# Patient Record
Sex: Male | Born: 1965
Health system: Southern US, Community
[De-identification: ages and names within clinical notes are randomized; demographics above are authoritative.]

## PROBLEM LIST (undated history)

## (undated) DIAGNOSIS — G709 Myoneural disorder, unspecified: Secondary | ICD-10-CM

## (undated) DIAGNOSIS — M1990 Unspecified osteoarthritis, unspecified site: Secondary | ICD-10-CM

## (undated) HISTORY — DX: Myoneural disorder, unspecified: G70.9

## (undated) HISTORY — DX: Unspecified osteoarthritis, unspecified site: M19.90

---

## 1983-08-04 HISTORY — PX: BACK SURGERY: SHX140

## 1995-08-04 HISTORY — PX: KNEE SURGERY: SHX244

## 1998-10-08 ENCOUNTER — Encounter: Payer: Self-pay | Admitting: Emergency Medicine

## 1998-10-08 ENCOUNTER — Emergency Department (HOSPITAL_COMMUNITY): Admission: EM | Admit: 1998-10-08 | Discharge: 1998-10-08 | Payer: Self-pay | Admitting: Emergency Medicine

## 1998-12-09 ENCOUNTER — Ambulatory Visit (HOSPITAL_BASED_OUTPATIENT_CLINIC_OR_DEPARTMENT_OTHER): Admission: RE | Admit: 1998-12-09 | Discharge: 1998-12-09 | Payer: Self-pay | Admitting: Orthopedic Surgery

## 2006-09-26 ENCOUNTER — Emergency Department (HOSPITAL_COMMUNITY): Admission: EM | Admit: 2006-09-26 | Discharge: 2006-09-27 | Payer: Self-pay | Admitting: Emergency Medicine

## 2007-02-12 ENCOUNTER — Emergency Department (HOSPITAL_COMMUNITY): Admission: EM | Admit: 2007-02-12 | Discharge: 2007-02-12 | Payer: Self-pay | Admitting: Emergency Medicine

## 2007-02-14 ENCOUNTER — Emergency Department (HOSPITAL_COMMUNITY): Admission: EM | Admit: 2007-02-14 | Discharge: 2007-02-14 | Payer: Self-pay | Admitting: Emergency Medicine

## 2007-07-06 ENCOUNTER — Emergency Department (HOSPITAL_COMMUNITY): Admission: EM | Admit: 2007-07-06 | Discharge: 2007-07-06 | Payer: Self-pay | Admitting: Emergency Medicine

## 2007-09-02 ENCOUNTER — Ambulatory Visit (HOSPITAL_COMMUNITY): Admission: RE | Admit: 2007-09-02 | Discharge: 2007-09-02 | Payer: Self-pay | Admitting: Neurosurgery

## 2010-12-15 ENCOUNTER — Observation Stay (HOSPITAL_COMMUNITY)
Admission: EM | Admit: 2010-12-15 | Discharge: 2010-12-16 | Disposition: A | Discharge status: Home / Self Care | Payer: Medicare Other | Attending: Emergency Medicine | Admitting: Emergency Medicine

## 2010-12-15 ENCOUNTER — Emergency Department (HOSPITAL_COMMUNITY): Payer: Medicare Other

## 2010-12-15 DIAGNOSIS — R059 Cough, unspecified: Secondary | ICD-10-CM | POA: Insufficient documentation

## 2010-12-15 DIAGNOSIS — R5383 Other fatigue: Secondary | ICD-10-CM | POA: Insufficient documentation

## 2010-12-15 DIAGNOSIS — J189 Pneumonia, unspecified organism: Secondary | ICD-10-CM | POA: Insufficient documentation

## 2010-12-15 DIAGNOSIS — R05 Cough: Secondary | ICD-10-CM | POA: Insufficient documentation

## 2010-12-15 DIAGNOSIS — E860 Dehydration: Principal | ICD-10-CM | POA: Insufficient documentation

## 2010-12-15 DIAGNOSIS — R51 Headache: Secondary | ICD-10-CM | POA: Insufficient documentation

## 2010-12-15 DIAGNOSIS — R5381 Other malaise: Secondary | ICD-10-CM | POA: Insufficient documentation

## 2010-12-15 DIAGNOSIS — IMO0001 Reserved for inherently not codable concepts without codable children: Secondary | ICD-10-CM | POA: Insufficient documentation

## 2010-12-15 DIAGNOSIS — R112 Nausea with vomiting, unspecified: Secondary | ICD-10-CM | POA: Insufficient documentation

## 2010-12-15 DIAGNOSIS — R197 Diarrhea, unspecified: Secondary | ICD-10-CM | POA: Insufficient documentation

## 2010-12-15 LAB — POCT I-STAT, CHEM 8
Calcium, Ion: 1.09 mmol/L — ABNORMAL LOW (ref 1.12–1.32)
Glucose, Bld: 124 mg/dL — ABNORMAL HIGH (ref 70–99)
Hemoglobin: 15.6 g/dL (ref 13.0–17.0)
Potassium: 3.6 mEq/L (ref 3.5–5.1)
Sodium: 137 mEq/L (ref 135–145)
TCO2: 27 mmol/L (ref 0–100)

## 2010-12-15 LAB — COMPREHENSIVE METABOLIC PANEL
CO2: 29 mEq/L (ref 19–32)
Calcium: 9.3 mg/dL (ref 8.4–10.5)
GFR calc non Af Amer: >60 mL/min (ref >60)
Potassium: 3.5 mEq/L (ref 3.5–5.1)
Sodium: 138 mEq/L (ref 135–145)
Total Bilirubin: 0.7 mg/dL (ref 0.3–1.2)

## 2010-12-16 LAB — HEPATITIS PANEL, ACUTE
HCV Ab: NEGATIVE
Hep A IgM: NEGATIVE

## 2012-06-22 ENCOUNTER — Emergency Department (HOSPITAL_COMMUNITY)
Admission: EM | Admit: 2012-06-22 | Discharge: 2012-06-23 | Disposition: A | Discharge status: Home / Self Care | Payer: Medicare Other | Attending: Emergency Medicine | Admitting: Emergency Medicine

## 2012-06-22 ENCOUNTER — Encounter (HOSPITAL_COMMUNITY): Payer: Self-pay | Admitting: Emergency Medicine

## 2012-06-22 DIAGNOSIS — R42 Dizziness and giddiness: Secondary | ICD-10-CM | POA: Insufficient documentation

## 2012-06-22 DIAGNOSIS — R109 Unspecified abdominal pain: Secondary | ICD-10-CM | POA: Insufficient documentation

## 2012-06-22 DIAGNOSIS — R112 Nausea with vomiting, unspecified: Secondary | ICD-10-CM | POA: Insufficient documentation

## 2012-06-22 DIAGNOSIS — M62830 Muscle spasm of back: Secondary | ICD-10-CM

## 2012-06-22 DIAGNOSIS — M5380 Other specified dorsopathies, site unspecified: Secondary | ICD-10-CM | POA: Insufficient documentation

## 2012-06-22 LAB — URINALYSIS, ROUTINE W REFLEX MICROSCOPIC
Bilirubin Urine: NEGATIVE
Ketones, ur: NEGATIVE mg/dL
Specific Gravity, Urine: 1.024 (ref 1.005–1.030)

## 2012-06-22 LAB — BASIC METABOLIC PANEL
BUN: 14 mg/dL (ref 6–23)
CO2: 29 mEq/L (ref 19–32)
Calcium: 9.5 mg/dL (ref 8.4–10.5)
Chloride: 104 mEq/L (ref 96–112)
Creatinine, Ser: 1.12 mg/dL (ref 0.50–1.35)
GFR calc Af Amer: 88 mL/min — ABNORMAL LOW (ref >90)
GFR calc non Af Amer: 76 mL/min — ABNORMAL LOW (ref >90)
Glucose, Bld: 106 mg/dL — ABNORMAL HIGH (ref 70–99)
Potassium: 4.4 mEq/L (ref 3.5–5.1)

## 2012-06-22 LAB — CBC WITH DIFFERENTIAL/PLATELET
Basophils Absolute: 0 10*3/uL (ref 0.0–0.1)
Eosinophils Absolute: 0.3 10*3/uL (ref 0.0–0.7)
Lymphocytes Relative: 19 % (ref 12–46)
Monocytes Absolute: 0.5 10*3/uL (ref 0.1–1.0)
Monocytes Relative: 6 % (ref 3–12)
Neutro Abs: 5.8 10*3/uL (ref 1.7–7.7)
Neutrophils Relative %: 71 % (ref 43–77)
RDW: 12.8 % (ref 11.5–15.5)
WBC: 8.2 10*3/uL (ref 4.0–10.5)

## 2012-06-22 MED ORDER — MECLIZINE HCL 25 MG PO TABS
25.0000 mg | ORAL_TABLET | Freq: Once | ORAL | Status: AC
  Administered 2012-06-22: 25 mg via ORAL
  Filled 2012-06-22: qty 1

## 2012-06-22 MED ORDER — DIAZEPAM 5 MG PO TABS
10.0000 mg | ORAL_TABLET | Freq: Once | ORAL | Status: AC
  Administered 2012-06-22: 10 mg via ORAL
  Filled 2012-06-22: qty 2

## 2012-06-22 MED ORDER — HYDROCODONE-ACETAMINOPHEN 5-325 MG PO TABS
2.0000 | ORAL_TABLET | Freq: Once | ORAL | Status: AC
  Administered 2012-06-22: 2 via ORAL
  Filled 2012-06-22: qty 2

## 2012-06-22 NOTE — ED Notes (Addendum)
Pt presents with dizziness for the past 2 weeks, states it occurs when he moves, becomes lightheaded and has blurred vision.  Complaining of tightness in left side of chest.  Also states he becomes nauseas with movement.  Denies any vomiting today.  Pt having left flank pain that began 2 days ago, sts urine is darker in color than normal.  No acute distress at this time.

## 2012-06-22 NOTE — ED Notes (Signed)
Pt c/o left flank pain x 3 days and intermittent dizziness x 2 weeks; pt sts painful with movement and denies pain with urination

## 2012-06-22 NOTE — ED Notes (Signed)
No answer in waiting area when pt called for treatment room.

## 2012-06-22 NOTE — ED Notes (Signed)
Patient ambulated independently around the department.  Patient states he feels much better, denies any dizziness and states that his back is no longer hurting.

## 2012-06-22 NOTE — ED Provider Notes (Signed)
History     CSN: [id number]  Arrival date & time 06/22/12  1840   First MD Initiated Contact with Patient 06/22/12 2148      Chief Complaint  Patient presents with  . Flank Pain  . Dizziness    (Consider location/radiation/quality/duration/timing/severity/associated sxs/prior treatment) The history is provided by the patient, a relative and medical records.   presents to the emergency department complaining of dizziness for greater than 2 weeks and 3 days for the back pain.  He states the dizziness began quickly but not acutely, have been persistent and gradually worsening. He has no history of vertigo. He describes the dizziness as the room spinning. He states that it is worse when he stands or attempts to walk. He states that at the dizziness worse and he developed severe nausea and occasional vomiting. He states the symptoms are better when he lies down and worse when he stands up. He has tried taking Dramamine without improvement in the symptoms. He has associated nausea and vomiting. He denies headache, otalgia, tinnitus, chest pain, shortness of breath, abdominal pain, diarrhea, constipation, weakness, syncope.  Patient also complains of left back pain. Patient states he has an extensive history of back pain with previous back fractures and surgeries. He states he is a fall and has no injury this time. He states he's been in bed with the dizziness and he awoke 3 days ago with pain in the left side of his back. He states he is unsure about whether or not it is the kidney. He states the pain worsened severely when he coughs, sneezes, moves, twists, walks. He states that lying still makes the pain feel better. He has not taken any medicine for his back pain either. He denies numbness, tingling, weakness in his legs. He denies dysuria, hematuria, frequency, urgency, abdominal pain. He has no history of kidney stones.  Patient denies loss of function of his legs, loss of bowel or bladder  function or saddle anesthesia.    Anthony Landry is a 46 y.o. male   History reviewed. No pertinent past medical history.  History reviewed. No pertinent past surgical history.  History reviewed. No pertinent family history.  History  Substance Use Topics  . Smoking status: Never Smoker   . Smokeless tobacco: Not on file  . Alcohol Use: No      Review of Systems  Constitutional: Negative for fever, diaphoresis, appetite change, fatigue and unexpected weight change.  HENT: Negative for hearing loss, ear pain, congestion, rhinorrhea, sneezing, drooling, mouth sores, trouble swallowing, neck pain, neck stiffness, postnasal drip, sinus pressure and tinnitus.   Eyes: Negative for visual disturbance.  Respiratory: Negative for cough, chest tightness, shortness of breath and wheezing.   Cardiovascular: Negative for chest pain.  Gastrointestinal: Positive for nausea and vomiting. Negative for abdominal pain, diarrhea and constipation.  Genitourinary: Negative for dysuria, urgency, frequency and hematuria.  Musculoskeletal: Positive for back pain and gait problem (secondary back pain).  Skin: Negative for rash.  Neurological: Positive for dizziness. Negative for syncope, light-headedness and headaches.  Psychiatric/Behavioral: Negative for sleep disturbance. The patient is not nervous/anxious.   All other systems reviewed and are negative.    Allergies  Review of patient's allergies indicates no known allergies.  Home Medications   Current Outpatient Rx  Name  Route  Sig  Dispense  Refill  . MORPHINE SULFATE ER 60 MG PO TBCR   Oral   Take 60 mg by mouth 3 (three) times daily as needed. Back  pain         . OXYCODONE HCL 15 MG PO TABS   Oral   Take 15 mg by mouth every 8 (eight) hours as needed. Back pain         . MECLIZINE HCL 50 MG PO TABS   Oral   Take 0.5 tablets (25 mg total) by mouth 3 (three) times daily as needed.   30 tablet   0   . METHOCARBAMOL 500 MG PO  TABS   Oral   Take 1 tablet (500 mg total) by mouth 2 (two) times daily.   20 tablet   0     BP 123/77  Pulse 66  Temp 98.5 F (36.9 C) (Oral)  Resp 19  SpO2 99%  Physical Exam  Nursing note and vitals reviewed. Constitutional: He is oriented to person, place, and time. He appears well-developed and well-nourished. No distress.  HENT:  Head: Normocephalic and atraumatic.  Right Ear: Tympanic membrane, external ear and ear canal normal.  Left Ear: Tympanic membrane, external ear and ear canal normal.  Nose: Nose normal. Right sinus exhibits no maxillary sinus tenderness and no frontal sinus tenderness. Left sinus exhibits no maxillary sinus tenderness and no frontal sinus tenderness.  Mouth/Throat: Uvula is midline, oropharynx is clear and moist and mucous membranes are normal. Mucous membranes are not cyanotic. No uvula swelling. No oropharyngeal exudate.  Eyes: Conjunctivae normal and EOM are normal. Pupils are equal, round, and reactive to light. No scleral icterus.  Neck: Normal range of motion. Neck supple.  Cardiovascular: Normal rate, regular rhythm and intact distal pulses.   Pulmonary/Chest: Effort normal and breath sounds normal. No respiratory distress. He has no wheezes.  Abdominal: Soft. Bowel sounds are normal. He exhibits no distension and no mass. There is no tenderness. There is no rebound and no guarding.  Musculoskeletal: Normal range of motion. He exhibits tenderness. He exhibits no edema.       Tenderness to palpation of the left paraspinal muscles, no tenderness to palpation of the spinous processes Well healed surgical incision of the thoracic spine  Lymphadenopathy:    He has no cervical adenopathy.  Neurological: He is alert and oriented to person, place, and time. He has normal reflexes. He exhibits normal muscle tone. Coordination normal.       Speech is clear and goal oriented, follows commands Major Cranial nerves without deficit,  Normal strength in  upper and lower extremities bilaterally including dorsiflexion and plantar flexion, strong and equal grip strength Sensation normal to light and sharp touch Moves extremities without ataxia, coordination intact Normal gait and balance Positive Dix Hallpike maneuver   Skin: Skin is warm and dry. No rash noted. He is not diaphoretic.  Psychiatric: He has a normal mood and affect.    ED Course  Procedures (including critical care time)  Labs Reviewed  BASIC METABOLIC PANEL - Abnormal; Notable for the following:    Glucose, Bld 106 (*)     GFR calc non Af Amer 76 (*)     GFR calc Af Amer 88 (*)     All other components within normal limits  CBC WITH DIFFERENTIAL  URINALYSIS, ROUTINE W REFLEX MICROSCOPIC   No results found.  ECG:  Date: 06/22/2012  Rate: 72  Rhythm: normal sinus rhythm  QRS Axis: normal  Intervals: normal  ST/T Wave abnormalities: normal  Conduction Disutrbances:none  Narrative Interpretation: non-ischemic ECG  Old EKG Reviewed: none available    1. Vertigo   2.  Back muscle spasm       MDM  [name] presents with dizziness and back pain.  Patient with positive Gilberto Better, history and PE consistent with benign positional vertigo.  Patient given meclizine with resolution of dizziness nausea and vomiting.  Patient history and exam consistent with muscle spasm of the thoracic spine.  Patient given Vicodin and Valium with resolution of back pain. Patient now able to ambulate without difficulty; alert, oriented, NAD, nontoxic, nonseptic appearing.  No red flags concerning patient's back pain. No s/s of central cord compression or cauda equina. Lower extremities are neurovascularly intact and patient is ambulating without difficulty.  BMP, urinalysis and CBC are unremarkable.  ECG nonischemic.  Patient states he feels much better and is ready to go home.  I have also discussed reasons to return immediately to the ER.  Patient expresses understanding and agrees  with plan.  1. Medications: Meclizine - Take as needed for vertigo symptoms; Robaxin - take as needed for muscle spasm, take your already prescribed Roxicodone for back pain  2. Treatment: Rest, ice, gentle stretching, take medications as prescribed  3. Follow Up: [facility] urgent care and/or your pain management            Dierdre Forth, PA-C 06/23/12 0008

## 2012-06-23 MED ORDER — METHOCARBAMOL 500 MG PO TABS: 500.0000 mg | ORAL_TABLET | Freq: Two times a day (BID) | ORAL | Status: DC

## 2012-06-23 MED ORDER — MECLIZINE HCL 50 MG PO TABS: 25.0000 mg | ORAL_TABLET | Freq: Three times a day (TID) | ORAL | Status: DC | PRN

## 2012-06-23 NOTE — ED Provider Notes (Signed)
Medical screening examination/treatment/procedure(s) were performed by non-physician practitioner and as supervising physician I was immediately available for consultation/collaboration.    Margeaux Swantek L Ellenie Salome, MD 06/23/12 2229 

## 2014-11-10 DIAGNOSIS — R030 Elevated blood-pressure reading, without diagnosis of hypertension: Secondary | ICD-10-CM | POA: Insufficient documentation

## 2014-11-10 DIAGNOSIS — G8929 Other chronic pain: Secondary | ICD-10-CM | POA: Insufficient documentation

## 2015-01-01 ENCOUNTER — Encounter: Payer: Self-pay | Admitting: Pain Medicine

## 2015-01-01 ENCOUNTER — Encounter (INDEPENDENT_AMBULATORY_CARE_PROVIDER_SITE_OTHER): Payer: Self-pay

## 2015-01-01 ENCOUNTER — Ambulatory Visit: Payer: PPO | Attending: Pain Medicine | Admitting: Pain Medicine

## 2015-01-01 VITALS — BP 132/85 | HR 63 | Temp 98.4°F | Resp 16 | Ht 68.0 in | Wt 160.0 lb

## 2015-01-01 DIAGNOSIS — M5481 Occipital neuralgia: Secondary | ICD-10-CM | POA: Insufficient documentation

## 2015-01-01 DIAGNOSIS — M79601 Pain in right arm: Secondary | ICD-10-CM | POA: Diagnosis present

## 2015-01-01 DIAGNOSIS — M5030 Other cervical disc degeneration, unspecified cervical region: Secondary | ICD-10-CM | POA: Diagnosis not present

## 2015-01-01 DIAGNOSIS — M5136 Other intervertebral disc degeneration, lumbar region: Secondary | ICD-10-CM | POA: Diagnosis not present

## 2015-01-01 DIAGNOSIS — Z981 Arthrodesis status: Secondary | ICD-10-CM | POA: Insufficient documentation

## 2015-01-01 DIAGNOSIS — M47812 Spondylosis without myelopathy or radiculopathy, cervical region: Secondary | ICD-10-CM

## 2015-01-01 DIAGNOSIS — M533 Sacrococcygeal disorders, not elsewhere classified: Secondary | ICD-10-CM | POA: Insufficient documentation

## 2015-01-01 DIAGNOSIS — Z9889 Other specified postprocedural states: Secondary | ICD-10-CM

## 2015-01-01 DIAGNOSIS — M47816 Spondylosis without myelopathy or radiculopathy, lumbar region: Secondary | ICD-10-CM | POA: Insufficient documentation

## 2015-01-01 DIAGNOSIS — M5412 Radiculopathy, cervical region: Secondary | ICD-10-CM | POA: Insufficient documentation

## 2015-01-01 DIAGNOSIS — M79602 Pain in left arm: Secondary | ICD-10-CM | POA: Diagnosis present

## 2015-01-01 DIAGNOSIS — M542 Cervicalgia: Secondary | ICD-10-CM | POA: Diagnosis present

## 2015-01-01 NOTE — Progress Notes (Signed)
Discharged ambulatory at 1226 no meds

## 2015-01-01 NOTE — Patient Instructions (Addendum)
Continue present medications.  F/U PCP for evaliation of  BP and general medical  condition.  F/U surgical evaluation.We need to schedule neurosurgical eval  of lower back pain and lower extremity pain . We will need to report of the neurosurgeon prior to your return appointment in the pain clinic  F/U neurological evaluation.  May consider radiofrequency rhizolysis or intraspinal procedures pending response to present treatment and F/U evaluation.  Patient to call Pain Management Center should patient have concerns prior to scheduled return appointment.

## 2015-01-01 NOTE — Progress Notes (Signed)
Safety precautions to be maintained throughout the outpatient stay will include: orient to surroundings, keep bed in low position, maintain call bell within reach at all times, provide assistance with transfer out of bed and ambulation.  
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
## 2015-01-09 ENCOUNTER — Other Ambulatory Visit: Payer: Self-pay | Admitting: Pain Medicine

## 2015-01-31 ENCOUNTER — Ambulatory Visit: Payer: PPO | Attending: Pain Medicine | Admitting: Pain Medicine

## 2015-01-31 ENCOUNTER — Encounter: Payer: Self-pay | Admitting: Pain Medicine

## 2015-01-31 VITALS — BP 110/62 | HR 54 | Temp 97.6°F | Resp 16 | Ht 68.0 in | Wt 163.0 lb

## 2015-01-31 DIAGNOSIS — G039 Meningitis, unspecified: Secondary | ICD-10-CM | POA: Insufficient documentation

## 2015-01-31 DIAGNOSIS — Z981 Arthrodesis status: Secondary | ICD-10-CM

## 2015-01-31 DIAGNOSIS — M533 Sacrococcygeal disorders, not elsewhere classified: Secondary | ICD-10-CM | POA: Diagnosis not present

## 2015-01-31 DIAGNOSIS — M79604 Pain in right leg: Secondary | ICD-10-CM | POA: Diagnosis present

## 2015-01-31 DIAGNOSIS — M4806 Spinal stenosis, lumbar region: Secondary | ICD-10-CM | POA: Insufficient documentation

## 2015-01-31 DIAGNOSIS — M5030 Other cervical disc degeneration, unspecified cervical region: Secondary | ICD-10-CM

## 2015-01-31 DIAGNOSIS — M5136 Other intervertebral disc degeneration, lumbar region: Secondary | ICD-10-CM | POA: Insufficient documentation

## 2015-01-31 DIAGNOSIS — M5481 Occipital neuralgia: Secondary | ICD-10-CM

## 2015-01-31 DIAGNOSIS — Z9889 Other specified postprocedural states: Secondary | ICD-10-CM

## 2015-01-31 DIAGNOSIS — M79605 Pain in left leg: Secondary | ICD-10-CM | POA: Diagnosis present

## 2015-01-31 DIAGNOSIS — M47812 Spondylosis without myelopathy or radiculopathy, cervical region: Secondary | ICD-10-CM

## 2015-01-31 DIAGNOSIS — M47816 Spondylosis without myelopathy or radiculopathy, lumbar region: Secondary | ICD-10-CM

## 2015-01-31 DIAGNOSIS — M546 Pain in thoracic spine: Secondary | ICD-10-CM | POA: Diagnosis present

## 2015-01-31 NOTE — Progress Notes (Signed)
Subjective:    Patient ID: Anthony Landry, male    DOB: 27-Oct-1965, 49 y.o.   MRN: [id number]  HPI   Patient is 49 year old gentleman who returns to pain medicine Center for further evaluation and treatment of pain involving the neck and entire back upper and lower extremity regions. On today's visit patient states his most severe pain involving the region of the lower back and buttocks and lower extremity region. Reviewed MRI on today's visit and informed patient that we will consider patient for interventional treatment at time return appointment. Discussed medications and informed patient that we would need to obtain permission from Drs. [id number] is to prescribed medications for treatment of patient's pain. We discussed further evaluation including surgical and neurological evaluation and decided to proceed with block of nerves to the sacroiliac joint at time return appointment in attempt to decrease severity of patient's symptoms, minimize progression of symptoms, and hopefully avoid the need for more involved treatment. The patient was understanding and agreement status treatment plan.    Review of Systems     Objective:   Physical Exam  There was tenderness of the splenius capitis and occipitalis region of mild degree. Palpation of the cervical facet cervical paraspinal muscle region was a tends to palpation of mild to moderate degree. Palpation of the thoracic facet thoracic paraspinal musculature region was a tends to palpation of mild to moderate degree. With no crepitus of the thoracic region haven't been noted. There appeared to be unremarkable Spurling's maneuver. Tinel and Phalen's maneuver were without increase of pain of significant degree. There was tenderness of the thoracic facet thoracic paraspinal musculature region with evidence of significant muscle spasms of the lower thoracic paraspinal musculature region. Palpation over the lumbar paraspinal musculature region lumbar facet  region associated tends to palpation of moderate to moderately severe degree with lateral bending rotation extension and palpation over the lumbar facets reproducing moderate to moderately severe discomfort. Palpation of the PSIS and PII S region reproduced moderately severe discomfort with severe increase of pain with pressure applied to the ilium with patient in lateral decubitus position. There was severe increased pain with attempt to perform Patrick's maneuver. No definite sensory deficit of dermatomal disability detected. Negative clonus negative Homans. Abdomen was nontender with no costovertebral angle tenderness noted.      Assessment & Plan:  Degenerative disc disease lumbar spine Status post severe L5 vertebral body collapse and decompression laminectomies L5 and S1. Absent contrast within the thecal sac at L5-S1 and throughout the sacral. There is concern regarding arachnoiditis with petitioning of the sac which otherwise appears widely patent. This likely etiology may be mixed intrathecal/epidural injection may have occurred prior to patient comes to pain management Center. Osseous foraminal stenosis on the right greater than the left at L5 the L5 nerve root and the right L4 nerve root mass effect upon the right posterior lateral thecal sac due to facet and posterior element hypertrophy at L2-3 and L3-4  Arachnoiditis  Sacroiliac joint dysfunction     Plan   Continue present medications  Dr. nerves to the sacroiliac joint to be performed at time return appointment  F/U PCP for evaliation of  BP and general medical  condition.  F/U surgical evaluation. Patient will follow-up with Dr. Lovell Sheehan for neurosurgical reevaluation  F/U neurological evaluation as discussed  May consider radiofrequency rhizolysis or intraspinal procedures pending response to present treatment and F/U evaluation.  Patient to call Pain Management Center should patient have concerns prior to  scheduled  return appointment.

## 2015-01-31 NOTE — Patient Instructions (Addendum)
Continue present medications  Block of nerves to the sacroiliac joint. Performed 02/20/2015  F/U PCP for evaliation of  BP and general medical  condition.  F/U surgical evaluation  F/U neurological evaluation  May consider radiofrequency rhizolysis or intraspinal procedures pending response to present treatment and F/U evaluation.  Patient to call [facility] should patient have concerns prior to scheduled return appointment. GENERAL RISKS AND COMPLICATIONS  What are the risk, side effects and possible complications? Generally speaking, most procedures are safe.  However, with any procedure there are risks, side effects, and the possibility of complications.  The risks and complications are dependent upon the sites that are lesioned, or the type of nerve block to be performed.  The closer the procedure is to the spine, the more serious the risks are.  Great care is taken when placing the radio frequency needles, block needles or lesioning probes, but sometimes complications can occur. 1. Infection: Any time there is an injection through the skin, there is a risk of infection.  This is why sterile conditions are used for these blocks.  There are four possible types of infection. 1. Localized skin infection. 2. Central Nervous System Infection-This can be in the form of Meningitis, which can be deadly. 3. Epidural Infections-This can be in the form of an epidural abscess, which can cause pressure inside of the spine, causing compression of the spinal cord with subsequent paralysis. This would require an emergency surgery to decompress, and there are no guarantees that the patient would recover from the paralysis. 4. Discitis-This is an infection of the intervertebral discs.  It occurs in about 1% of discography procedures.  It is difficult to treat and it may lead to surgery.        2. Pain: the needles have to go through skin and soft tissues, will cause soreness.       3. Damage to  internal structures:  The nerves to be lesioned may be near blood vessels or    other nerves which can be potentially damaged.       4. Bleeding: Bleeding is more common if the patient is taking blood thinners such as  aspirin, Coumadin, Ticiid, Plavix, etc., or if he/she have some genetic predisposition  such as hemophilia. Bleeding into the spinal canal can cause compression of the spinal  cord with subsequent paralysis.  This would require an emergency surgery to  decompress and there are no guarantees that the patient would recover from the  paralysis.       5. Pneumothorax:  Puncturing of a lung is a possibility, every time a needle is introduced in  the area of the chest or upper back.  Pneumothorax refers to free air around the  collapsed lung(s), inside of the thoracic cavity (chest cavity).  Another two possible  complications related to a similar event would include: Hemothorax and Chylothorax.   These are variations of the Pneumothorax, where instead of air around the collapsed  lung(s), you may have blood or chyle, respectively.       6. Spinal headaches: They may occur with any procedures in the area of the spine.       7. Persistent CSF (Cerebro-Spinal Fluid) leakage: This is a rare problem, but may occur  with prolonged intrathecal or epidural catheters either due to the formation of a fistulous  track or a dural tear.       8. Nerve damage: By working so close to the spinal cord, there is  always a possibility of  nerve damage, which could be as serious as a permanent spinal cord injury with  paralysis.       9. Death:  Although rare, severe deadly allergic reactions known as "Anaphylactic  reaction" can occur to any of the medications used.      10. Worsening of the symptoms:  We can always make thing worse.  What are the chances of something like this happening? Chances of any of this occuring are extremely low.  By statistics, you have more of a chance of getting killed in a motor  vehicle accident: while driving to the hospital than any of the above occurring .  Nevertheless, you should be aware that they are possibilities.  In general, it is similar to taking a shower.  Everybody knows that you can slip, hit your head and get killed.  Does that mean that you should not shower again?  Nevertheless always keep in mind that statistics do not mean anything if you happen to be on the wrong side of them.  Even if a procedure has a 1 (one) in a 1,000,000 (million) chance of going wrong, it you happen to be that one..Also, keep in mind that by statistics, you have more of a chance of having something go wrong when taking medications.  Who should not have this procedure? If you are on a blood thinning medication (e.g. Coumadin, Plavix, see list of "Blood Thinners"), or if you have an active infection going on, you should not have the procedure.  If you are taking any blood thinners, please inform your physician.  How should I prepare for this procedure?  Do not eat or drink anything at least six hours prior to the procedure.  Bring a driver with you .  It cannot be a taxi.  Come accompanied by an adult that can drive you back, and that is strong enough to help you if your legs get weak or numb from the local anesthetic.  Take all of your medicines the morning of the procedure with just enough water to swallow them.  If you have diabetes, make sure that you are scheduled to have your procedure done first thing in the morning, whenever possible.  If you have diabetes, take only half of your insulin dose and notify our nurse that you have done so as soon as you arrive at the clinic.  If you are diabetic, but only take blood sugar pills (oral hypoglycemic), then do not take them on the morning of your procedure.  You may take them after you have had the procedure.  Do not take aspirin or any aspirin-containing medications, at least eleven (11) days prior to the procedure.  They may  prolong bleeding.  Wear loose fitting clothing that may be easy to take off and that you would not mind if it got stained with Betadine or blood.  Do not wear any jewelry or perfume  Remove any nail coloring.  It will interfere with some of our monitoring equipment.  NOTE: Remember that this is not meant to be interpreted as a complete list of all possible complications.  Unforeseen problems may occur.  BLOOD THINNERS The following drugs contain aspirin or other products, which can cause increased bleeding during surgery and should not be taken for 2 weeks prior to and 1 week after surgery.  If you should need take something for relief of minor pain, you may take acetaminophen which is found in Tylenol,m Datril, Anacin-3 and  Panadol. It is not blood thinner. The products listed below are.  Do not take any of the products listed below in addition to any listed on your instruction sheet.  A.P.C or A.P.C with Codeine Codeine Phosphate Capsules #3 Ibuprofen Ridaura  ABC compound Congesprin Imuran rimadil  Advil Cope Indocin Robaxisal  Alka-Seltzer Effervescent Pain Reliever and Antacid Coricidin or Coricidin-D  Indomethacin Rufen  Alka-Seltzer plus Cold Medicine Cosprin Ketoprofen S-A-C Tablets  Anacin Analgesic Tablets or Capsules Coumadin Korlgesic Salflex  Anacin Extra Strength Analgesic tablets or capsules CP-2 Tablets Lanoril Salicylate  Anaprox Cuprimine Capsules Levenox Salocol  Anexsia-D Dalteparin Magan Salsalate  Anodynos Darvon compound Magnesium Salicylate Sine-off  Ansaid Dasin Capsules Magsal Sodium Salicylate  Anturane Depen Capsules Marnal Soma  APF Arthritis pain formula Dewitt's Pills Measurin Stanback  Argesic Dia-Gesic Meclofenamic Sulfinpyrazone  Arthritis Bayer Timed Release Aspirin Diclofenac Meclomen Sulindac  Arthritis pain formula Anacin Dicumarol Medipren Supac  Analgesic (Safety coated) Arthralgen Diffunasal Mefanamic Suprofen  Arthritis Strength Bufferin  Dihydrocodeine Mepro Compound Suprol  Arthropan liquid Dopirydamole Methcarbomol with Aspirin Synalgos  ASA tablets/Enseals Disalcid Micrainin Tagament  Ascriptin Doan's Midol Talwin  Ascriptin A/D Dolene Mobidin Tanderil  Ascriptin Extra Strength Dolobid Moblgesic Ticlid  Ascriptin with Codeine Doloprin or Doloprin with Codeine Momentum Tolectin  Asperbuf Duoprin Mono-gesic Trendar  Aspergum Duradyne Motrin or Motrin IB Triminicin  Aspirin plain, buffered or enteric coated Durasal Myochrisine Trigesic  Aspirin Suppositories Easprin Nalfon Trillsate  Aspirin with Codeine Ecotrin Regular or Extra Strength Naprosyn Uracel  Atromid-S Efficin Naproxen Ursinus  Auranofin Capsules Elmiron Neocylate Vanquish  Axotal Emagrin Norgesic Verin  Azathioprine Empirin or Empirin with Codeine Normiflo Vitamin E  Azolid Emprazil Nuprin Voltaren  Bayer Aspirin plain, buffered or children's or timed BC Tablets or powders Encaprin Orgaran Warfarin Sodium  Buff-a-Comp Enoxaparin Orudis Zorpin  Buff-a-Comp with Codeine Equegesic Os-Cal-Gesic   Buffaprin Excedrin plain, buffered or Extra Strength Oxalid   Bufferin Arthritis Strength Feldene Oxphenbutazone   Bufferin plain or Extra Strength Feldene Capsules Oxycodone with Aspirin   Bufferin with Codeine Fenoprofen Fenoprofen Pabalate or Pabalate-SF   Buffets II Flogesic Panagesic   Buffinol plain or Extra Strength Florinal or Florinal with Codeine Panwarfarin   Buf-Tabs Flurbiprofen Penicillamine   Butalbital Compound Four-way cold tablets Penicillin   Butazolidin Fragmin Pepto-Bismol   Carbenicillin Geminisyn Percodan   Carna Arthritis Reliever Geopen Persantine   Carprofen Gold's salt Persistin   Chloramphenicol Goody's Phenylbutazone   Chloromycetin Haltrain Piroxlcam   Clmetidine heparin Plaquenil   Cllnoril Hyco-pap Ponstel   Clofibrate Hydroxy chloroquine Propoxyphen         Before stopping any of these medications, be sure to consult the  physician who ordered them.  Some, such as Coumadin (Warfarin) are ordered to prevent or treat serious conditions such as "deep thrombosis", "pumonary embolisms", and other heart problems.  The amount of time that you may need off of the medication may also vary with the medication and the reason for which you were taking it.  If you are taking any of these medications, please make sure you notify your pain physician before you undergo any procedures.         Pain Management Discharge Instructions  General Discharge Instructions :  If you need to reach your doctor call: Monday-Friday 8:00 am - 4:00 pm at [phone] or toll free [phone].  After clinic hours [phone] to have operator reach doctor.  Bring all of your medication bottles to all your appointments in the pain clinic.  To  cancel or reschedule your appointment with Pain Management please remember to call 24 hours in advance to avoid a fee.  Refer to the educational materials which you have been given on: General Risks, I had my Procedure. Discharge Instructions, Post Sedation.  Post Procedure Instructions:  The drugs you were given will stay in your system until tomorrow, so for the next 24 hours you should not drive, make any legal decisions or drink any alcoholic beverages.  You may eat anything you prefer, but it is better to start with liquids then soups and crackers, and gradually work up to solid foods.  Please notify your doctor immediately if you have any unusual bleeding, trouble breathing or pain that is not related to your normal pain.  Depending on the type of procedure that was done, some parts of your body may feel week and/or numb.  This usually clears up by tonight or the next day.  Walk with the use of an assistive device or accompanied by an adult for the 24 hours.  You may use ice on the affected area for the first 24 hours.  Put ice in a Ziploc bag and cover with a towel and place against area  15 minutes on 15 minutes off.  You may switch to heat after 24 hours.Sacroiliac (SI) Joint Injection Patient Information  Description: The sacroiliac joint connects the scrum (very low back and tailbone) to the ilium (a pelvic bone which also forms half of the hip joint).  Normally this joint experiences very little motion.  When this joint becomes inflamed or unstable low back and or hip and pelvis pain may result.  Injection of this joint with local anesthetics (numbing medicines) and steroids can provide diagnostic information and reduce pain.  This injection is performed with the aid of x-ray guidance into the tailbone area while you are lying on your stomach.   You may experience an electrical sensation down the leg while this is being done.  You may also experience numbness.  We also may ask if we are reproducing your normal pain during the injection.  Conditions which may be treated SI injection:   Low back, buttock, hip or leg pain  Preparation for the Injection:  1. Do not eat any solid food or dairy products within 6 hours of your appointment.  2. You may drink clear liquids up to 2 hours before appointment.  Clear liquids include water, black coffee, juice or soda.  No milk or cream please. 3. You may take your regular medications, including pain medications with a sip of water before your appointment.  Diabetics should hold regular insulin (if take separately) and take 1/2 normal NPH dose the morning of the procedure.  Carry some sugar containing items with you to your appointment. 4. A driver must accompany you and be prepared to drive you home after your procedure. 5. Bring all of your current medications with you. 6. An IV may be inserted and sedation may be given at the discretion of the physician. 7. A blood pressure cuff, EKG and other monitors will often be applied during the procedure.  Some patients may need to have extra oxygen administered for a short period.  8. You will  be asked to provide medical information, including your allergies, prior to the procedure.  We must know immediately if you are taking blood thinners (like Coumadin/Warfarin) or if you are allergic to IV iodine contrast (dye).  We must know if you could possible be pregnant.  Possible side effects:   Bleeding from needle site  Infection (rare, may require surgery)  Nerve injury (rare)  Numbness & tingling (temporary)  A brief convulsion or seizure  Light-headedness (temporary)  Pain at injection site (several days)  Decreased blood pressure (temporary)  Weakness in the leg (temporary)   Call if you experience:   New onset weakness or numbness of an extremity below the injection site that last more than 8 hours.  Hives or difficulty breathing ( go to the emergency room)  Inflammation or drainage at the injection site  Any new symptoms which are concerning to you  Please note:  Although the local anesthetic injected can often make your back/ hip/ buttock/ leg feel good for several hours after the injections, the pain will likely return.  It takes 3-7 days for steroids to work in the sacroiliac area.  You may not notice any pain relief for at least that one week.  If effective, we will often do a series of three injections spaced 3-6 weeks apart to maximally decrease your pain.  After the initial series, we generally will wait some months before a repeat injection of the same type.  If you have any questions, please call [phone] [facility]

## 2015-01-31 NOTE — Progress Notes (Signed)
Safety precautions to be maintained throughout the outpatient stay will include: orient to surroundings, keep bed in low position, maintain call bell within reach at all times, provide assistance with transfer out of bed and ambulation.  

## 2015-02-18 ENCOUNTER — Ambulatory Visit: Payer: PPO | Attending: Pain Medicine | Admitting: Pain Medicine

## 2015-02-18 VITALS — BP 113/77 | HR 56 | Temp 98.1°F | Resp 12 | Ht 68.0 in | Wt 160.0 lb

## 2015-02-18 DIAGNOSIS — M4806 Spinal stenosis, lumbar region: Secondary | ICD-10-CM | POA: Diagnosis not present

## 2015-02-18 DIAGNOSIS — M5030 Other cervical disc degeneration, unspecified cervical region: Secondary | ICD-10-CM

## 2015-02-18 DIAGNOSIS — M47816 Spondylosis without myelopathy or radiculopathy, lumbar region: Secondary | ICD-10-CM

## 2015-02-18 DIAGNOSIS — M51369 Other intervertebral disc degeneration, lumbar region without mention of lumbar back pain or lower extremity pain: Secondary | ICD-10-CM

## 2015-02-18 DIAGNOSIS — M5126 Other intervertebral disc displacement, lumbar region: Secondary | ICD-10-CM | POA: Diagnosis not present

## 2015-02-18 DIAGNOSIS — M545 Low back pain: Secondary | ICD-10-CM | POA: Diagnosis present

## 2015-02-18 DIAGNOSIS — M47812 Spondylosis without myelopathy or radiculopathy, cervical region: Secondary | ICD-10-CM

## 2015-02-18 DIAGNOSIS — G039 Meningitis, unspecified: Secondary | ICD-10-CM

## 2015-02-18 DIAGNOSIS — M5136 Other intervertebral disc degeneration, lumbar region: Secondary | ICD-10-CM

## 2015-02-18 DIAGNOSIS — M79605 Pain in left leg: Secondary | ICD-10-CM | POA: Diagnosis present

## 2015-02-18 DIAGNOSIS — M5481 Occipital neuralgia: Secondary | ICD-10-CM

## 2015-02-18 DIAGNOSIS — Z9889 Other specified postprocedural states: Secondary | ICD-10-CM

## 2015-02-18 DIAGNOSIS — Z981 Arthrodesis status: Secondary | ICD-10-CM

## 2015-02-18 DIAGNOSIS — M79604 Pain in right leg: Secondary | ICD-10-CM | POA: Diagnosis present

## 2015-02-18 DIAGNOSIS — M533 Sacrococcygeal disorders, not elsewhere classified: Secondary | ICD-10-CM

## 2015-02-18 MED ORDER — MIDAZOLAM HCL 5 MG/5ML IJ SOLN
INTRAMUSCULAR | Status: AC
  Administered 2015-02-18: 5 mg via INTRAVENOUS
  Filled 2015-02-18: qty 5

## 2015-02-18 MED ORDER — FENTANYL CITRATE (PF) 100 MCG/2ML IJ SOLN
INTRAMUSCULAR | Status: AC
  Administered 2015-02-18: 100 ug via INTRAVENOUS
  Filled 2015-02-18: qty 2

## 2015-02-18 MED ORDER — TRIAMCINOLONE ACETONIDE 40 MG/ML IJ SUSP
INTRAMUSCULAR | Status: AC
  Administered 2015-02-18: 10 mg
  Filled 2015-02-18: qty 1

## 2015-02-18 MED ORDER — CEFAZOLIN SODIUM 1 G IJ SOLR
INTRAMUSCULAR | Status: AC
  Administered 2015-02-18: 1 g via INTRAVENOUS
  Filled 2015-02-18: qty 10

## 2015-02-18 MED ORDER — ORPHENADRINE CITRATE 30 MG/ML IJ SOLN
INTRAMUSCULAR | Status: AC
  Filled 2015-02-18: qty 2

## 2015-02-18 MED ORDER — CEFUROXIME AXETIL 250 MG PO TABS: 250.0000 mg | ORAL_TABLET | Freq: Two times a day (BID) | ORAL | Status: DC

## 2015-02-18 MED ORDER — BUPIVACAINE HCL (PF) 0.25 % IJ SOLN
INTRAMUSCULAR | Status: AC
  Administered 2015-02-18: 15 mL
  Filled 2015-02-18: qty 30

## 2015-02-18 NOTE — Progress Notes (Signed)
Subjective:    Patient ID: JERICHO ALCORN, male    DOB: [birth year], 49 y.o.   MRN: [id number]  HPI  PROCEDURE:  Block of nerves to the sacroiliac joint.   NOTE:  The patient is a 49 y.o. male who returns to the [facility] for further evaluation and treatment of pain involving the lower back and lower extremity region with pain in the region of the buttocks as well. Prior radiographic studies reveal *status post severe L5 vertebral body collapse with decompressive laminectomies L5 and S1. There is absent contrast within the thecal sac at L5-S1 and throughout the sacrum which may represent arachnoiditis with petitioning of the sac which otherwise appears widely patent less likely a mixed intrathecal/epidural injection may have occurred prior to patient coming to [facility]. Osseous foraminal stenosis on the right greater than the left L5 nerve root and L4 nerve root. Mass effect on the right posterior lateral thecal sac due to facet and posterior element hypertrophy L2-L3 and L3-L4. Patient is a severe pain upon palpation of the PSIS and PII S region and is with positive Patrick's maneuver .   There is concern regarding a significant component of the patient's pain being due to sacroiliac joint dysfunction The risks, benefits, expectations of the procedure have been discussed and explained to the patient who is understanding and willing to proceed with interventional treatment in attempt to decrease severity of patient's symptoms, minimize the risk of medication escalation and  hopefully retard the progression of the patient's symptoms. We will proceed with what is felt to be a medically necessary procedure, block of nerves to the sacroiliac joint.   DESCRIPTION OF PROCEDURE:  Block of nerves to the sacroiliac joint.   The patient was taken to the fluoroscopy suite. With the patient in the prone position with EKG, blood pressure, pulse and pulse oximetry monitoring, IV Versed, IV  fentanyl conscious sedation, Betadine prep of proposed entry site was performed.   Block of nerves at the L5 vertebral body level.   With the patient in prone position, under fluoroscopic guidance, a 22 -gauge needle was inserted at the L5 vertebral body level on the left  side. With 15 degrees oblique orientation a 22 -gauge needle was inserted in the region known as Burton's eye or eye of the Scotty dog. Following documentation of needle placement in the area of Burton's eye or eye of the Scotty dog under fluoroscopic guidance, needle placement was then accomplished at the sacral ala level on the left side.   Needle placement at the sacral ala.   With the patient in prone position under fluoroscopic guidance with AP view of the lumbosacral spine, a 22 -gauge needle was inserted in the region known as the sacral ala on the left side. Following documentation of needle placement on the left side under fluoroscopic guidance needle placement was then accomplished at the S1 foramen level.   Needle placement at the S1 foramen level.   With the patient in prone position under fluoroscopic guidance with AP view of the lumbosacral spine and cephalad orientation, a 22 -gauge needle was inserted at the superior and lateral border of the S1 foramen on the left side. Following documentation of needle placement at the S1 foramen level on the left side, needle placement was then accomplished at the S2 foramen level on the left side.   Needle placement at the S2 foramen level.   With the patient in prone position with AP view of  the lumbosacral spine with cephalad orientation, a 22 - gauge needle was inserted at the superior and lateral border of the S2 foramen under fluoroscopic guidance on the left side. Following needle placement at the L5 vertebral body level, sacral ala, S1 foramen and S2 foramen on the left side, needle placement was verified on lateral view under fluoroscopic guidance.  Following needle  placement documentation on lateral view, each needle was injected with 1 mL of 0.25% bupivacaine and Kenalog.   BLOCK OF THE NERVES TO SACROILIAC JOINT ON THE RIGHT SIDE The procedure was performed on the right side at the same levels as was performed on the left side and utilizing the same technique as on the left side and was performed under fluoroscopic guidance as on the left side   A total of 10mg  of Kenalog was utilized for the procedure.   PLAN:  1. Medications: The patient will continue presently prescribed medications.  2. The patient will be considered for modification of treatment regimen pending response to the procedure performed on today's visit.  3. The patient is to follow-up with primary care physician for evaluation of blood pressure and general medical condition following the procedure performed on today's visit.  4. Surgical evaluation as discussed.. We discussed further surgical evaluation which patient prefers to avoid at this time 5. Neurological evaluation as discussed. . We have discussed [facility] EMG studies and may consider such studies pending response to treatment and follow-up evaluation 6. The patient may be a candidate for radiofrequency procedures, implantation devices and other treatment pending response to treatment performed on today's visit and follow-up evaluation.  7. The patient has been advised to adhere to proper body mechanics and to avoid activities which may exacerbate the patient's symptoms.   Return appointment to Pain Management Center as scheduled.       Review of Systems     Objective:   Physical Exam        Assessment & Plan:

## 2015-02-18 NOTE — Patient Instructions (Addendum)
Continue present medications Begin  Ceftin antibiotic today and continue taking Ceftin for 7 days  F/U PCP for evaliation of  BP and general medical  condition.  F/U surgical evaluation  F/U neurological evaluation  May consider radiofrequency rhizolysis or intraspinal procedures pending response to present treatment and F/U evaluation.  Patient to call [facility] Management Center should patient have concerns prior to scheduled return appointment. Pain Management Discharge Instructions  General Discharge Instructions :  If you need to reach your doctor call: Monday-Friday 8:00 am - 4:00 pm at [phone] or toll free [phone].  After clinic hours [phone] to have operator reach doctor.  Bring all of your medication bottles to all your appointments in the pain clinic.  To cancel or reschedule your appointment with Pain Management please remember to call 24 hours in advance to avoid a fee.  Refer to the educational materials which you have been given on: General Risks, I had my Procedure. Discharge Instructions, Post Sedation.  Post Procedure Instructions:  The drugs you were given will stay in your system until tomorrow, so for the next 24 hours you should not drive, make any legal decisions or drink any alcoholic beverages.  You may eat anything you prefer, but it is better to start with liquids then soups and crackers, and gradually work up to solid foods.  Please notify your doctor immediately if you have any unusual bleeding, trouble breathing or pain that is not related to your normal pain.  Depending on the type of procedure that was done, some parts of your body may feel week and/or numb.  This usually clears up by tonight or the next day.  Walk with the use of an assistive device or accompanied by an adult for the 24 hours.  You may use ice on the affected area for the first 24 hours.  Put ice in a Ziploc bag and cover with a towel and place against area 15 minutes on  15 minutes off.  You may switch to heat after 24 hours.Selective Nerve Root Block Patient Information  Description: Specific nerve roots exit the spinal canal and these nerves can be compressed and inflamed by a bulging disc and bone spurs.  By injecting steroids on the nerve root, we can potentially decrease the inflammation surrounding these nerves, which often leads to decreased pain.  Also, by injecting local anesthesia on the nerve root, this can provide us helpful information to give to your referring doctor if it decreases your pain.  Selective nerve root blocks can be done along the spine from the neck to the low back depending on the location of your pain.   After numbing the skin with local anesthesia, a small needle is passed to the nerve root and the position of the needle is verified using x-ray pictures.  After the needle is in correct position, we then deposit the medication.  You may experience a pressure sensation while this is being done.  The entire block usually lasts less than 15 minutes.  Conditions that may be treated with selective nerve root blocks:  Low back and leg pain  Spinal stenosis  Diagnostic block prior to potential surgery  Neck and arm pain  Post laminectomy syndrome  Preparation for the injection:  1. Do not eat any solid food or dairy products within 6 hours of your appointment. 2. You may drink clear liquids up to 2 hours before an appointment.  Clear liquids include water, black coffee, juice or soda.  No milk  or cream please. 3. You may take your regular medications, including pain medications, with a sip of water before your appointment.  Diabetics should hold regular insulin (if taken separately) and take 1/2 normal NPH dose the morning of the procedure.  Carry some sugar containing items with you to your appointment. 4. A driver must accompany you and be prepared to drive you home after your procedure. 5. Bring all your current medications with  you. 6. An IV may be inserted and sedation may be given at the discretion of the physician. 7. A blood pressure cuff, EKG, and other monitors will often be applied during the procedure.  Some patients may need to have extra oxygen administered for a short period. 8. You will be asked to provide medical information, including allergies, prior to the procedure.  We must know immediately if you are taking blood  Thinners (like Coumadin) or if you are allergic to IV iodine contrast (dye).  Possible side-effects: All are usually temporary  Bleeding from needle site  Light headedness  Numbness and tingling  Decreased blood pressure  Weakness in arms/legs  Pressure sensation in back/neck  Pain at injection site (several days)  Possible complications: All are extremely rare  Infection  Nerve injury  Spinal headache (a headache wore with upright position)  Call if you experience:  Fever/chills associated with headache or increased back/neck pain  Headache worsened by an upright position  New onset weakness or numbness of an extremity below the injection site  Hives or difficulty breathing (go to the emergency room)  Inflammation or drainage at the injection site(s)  Severe back/neck pain greater than usual  New symptoms which are concerning to you  Please note:  Although the local anesthetic injected can often make your back or neck feel good for several hours after the injection the pain will likely return.  It takes 3-5 days for steroids to work on the nerve root. You may not notice any pain relief for at least one week.  If effective, we will often do a series of 3 injections spaced 3-6 weeks apart to maximally decrease your pain.    If you have any questions, please call [phone] [facility]

## 2015-02-18 NOTE — Progress Notes (Signed)
Safety precautions to be maintained throughout the outpatient stay will include: orient to surroundings, keep bed in low position, maintain call bell within reach at all times, provide assistance with transfer out of bed and ambulation.  

## 2015-02-19 ENCOUNTER — Telehealth: Payer: Self-pay | Admitting: *Deleted

## 2015-02-19 NOTE — Telephone Encounter (Signed)
No problems 

## 2015-03-05 ENCOUNTER — Ambulatory Visit: Payer: PPO | Attending: Pain Medicine | Admitting: Pain Medicine

## 2015-03-05 ENCOUNTER — Encounter: Payer: Self-pay | Admitting: Pain Medicine

## 2015-03-05 VITALS — BP 117/80 | HR 70 | Temp 98.2°F | Resp 16 | Ht 68.0 in | Wt 160.0 lb

## 2015-03-05 DIAGNOSIS — M5136 Other intervertebral disc degeneration, lumbar region: Secondary | ICD-10-CM | POA: Insufficient documentation

## 2015-03-05 DIAGNOSIS — M5481 Occipital neuralgia: Secondary | ICD-10-CM | POA: Diagnosis not present

## 2015-03-05 DIAGNOSIS — Z87828 Personal history of other (healed) physical injury and trauma: Secondary | ICD-10-CM | POA: Insufficient documentation

## 2015-03-05 DIAGNOSIS — M533 Sacrococcygeal disorders, not elsewhere classified: Secondary | ICD-10-CM

## 2015-03-05 DIAGNOSIS — M5030 Other cervical disc degeneration, unspecified cervical region: Secondary | ICD-10-CM

## 2015-03-05 DIAGNOSIS — G039 Meningitis, unspecified: Secondary | ICD-10-CM

## 2015-03-05 DIAGNOSIS — M51369 Other intervertebral disc degeneration, lumbar region without mention of lumbar back pain or lower extremity pain: Secondary | ICD-10-CM

## 2015-03-05 DIAGNOSIS — Z9889 Other specified postprocedural states: Secondary | ICD-10-CM

## 2015-03-05 DIAGNOSIS — M79604 Pain in right leg: Secondary | ICD-10-CM | POA: Diagnosis present

## 2015-03-05 DIAGNOSIS — Z981 Arthrodesis status: Secondary | ICD-10-CM

## 2015-03-05 DIAGNOSIS — M545 Low back pain: Secondary | ICD-10-CM | POA: Diagnosis present

## 2015-03-05 DIAGNOSIS — M47812 Spondylosis without myelopathy or radiculopathy, cervical region: Secondary | ICD-10-CM

## 2015-03-05 DIAGNOSIS — M79605 Pain in left leg: Secondary | ICD-10-CM | POA: Diagnosis present

## 2015-03-05 DIAGNOSIS — M47816 Spondylosis without myelopathy or radiculopathy, lumbar region: Secondary | ICD-10-CM

## 2015-03-05 NOTE — Progress Notes (Signed)
Safety precautions to be maintained throughout the outpatient stay will include: orient to surroundings, keep bed in low position, maintain call bell within reach at all times, provide assistance with transfer out of bed and ambulation.  
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
## 2015-03-05 NOTE — Patient Instructions (Addendum)
Continue present medication  Lumbar facet, medial branch nerve, blocks to be performed Monday, 03/11/2015  F/U PCP Dr Thedore Mins for evaliation of  BP and general medical  condition  F/U surgical evaluation  F/U neurological evaluation  May consider radiofrequency rhizolysis or intraspinal procedures pending response to present treatment and F/U evaluation   Patient to call Pain Management Center should patient have concerns prior to scheduled return appointmen.

## 2015-03-11 ENCOUNTER — Ambulatory Visit: Payer: PPO | Attending: Pain Medicine | Admitting: Pain Medicine

## 2015-03-11 ENCOUNTER — Encounter: Payer: Self-pay | Admitting: Pain Medicine

## 2015-03-11 VITALS — BP 119/76 | HR 57 | Temp 98.1°F | Resp 14 | Ht 68.0 in | Wt 165.0 lb

## 2015-03-11 DIAGNOSIS — M4806 Spinal stenosis, lumbar region: Secondary | ICD-10-CM | POA: Diagnosis not present

## 2015-03-11 DIAGNOSIS — M5030 Other cervical disc degeneration, unspecified cervical region: Secondary | ICD-10-CM

## 2015-03-11 DIAGNOSIS — M545 Low back pain: Secondary | ICD-10-CM | POA: Diagnosis present

## 2015-03-11 DIAGNOSIS — M5481 Occipital neuralgia: Secondary | ICD-10-CM

## 2015-03-11 DIAGNOSIS — M51369 Other intervertebral disc degeneration, lumbar region without mention of lumbar back pain or lower extremity pain: Secondary | ICD-10-CM

## 2015-03-11 DIAGNOSIS — Z981 Arthrodesis status: Secondary | ICD-10-CM

## 2015-03-11 DIAGNOSIS — M5136 Other intervertebral disc degeneration, lumbar region: Secondary | ICD-10-CM | POA: Diagnosis not present

## 2015-03-11 DIAGNOSIS — M47816 Spondylosis without myelopathy or radiculopathy, lumbar region: Secondary | ICD-10-CM

## 2015-03-11 DIAGNOSIS — Z87828 Personal history of other (healed) physical injury and trauma: Secondary | ICD-10-CM | POA: Diagnosis not present

## 2015-03-11 DIAGNOSIS — G039 Meningitis, unspecified: Secondary | ICD-10-CM

## 2015-03-11 DIAGNOSIS — M533 Sacrococcygeal disorders, not elsewhere classified: Secondary | ICD-10-CM

## 2015-03-11 DIAGNOSIS — M79605 Pain in left leg: Secondary | ICD-10-CM | POA: Diagnosis present

## 2015-03-11 DIAGNOSIS — Z9889 Other specified postprocedural states: Secondary | ICD-10-CM

## 2015-03-11 DIAGNOSIS — M79604 Pain in right leg: Secondary | ICD-10-CM | POA: Diagnosis present

## 2015-03-11 DIAGNOSIS — M47812 Spondylosis without myelopathy or radiculopathy, cervical region: Secondary | ICD-10-CM

## 2015-03-11 MED ORDER — CEFAZOLIN SODIUM 1 G IJ SOLR
INTRAMUSCULAR | Status: AC
  Administered 2015-03-11: 1 g
  Filled 2015-03-11: qty 10

## 2015-03-11 MED ORDER — ORPHENADRINE CITRATE 30 MG/ML IJ SOLN
INTRAMUSCULAR | Status: AC
  Filled 2015-03-11: qty 2

## 2015-03-11 MED ORDER — BUPIVACAINE HCL (PF) 0.25 % IJ SOLN
INTRAMUSCULAR | Status: AC
  Administered 2015-03-11: 12:00:00
  Filled 2015-03-11: qty 30

## 2015-03-11 MED ORDER — CEFUROXIME AXETIL 250 MG PO TABS: 250.0000 mg | ORAL_TABLET | Freq: Two times a day (BID) | ORAL | Status: DC

## 2015-03-11 MED ORDER — OXYCODONE HCL 15 MG PO TABS: ORAL_TABLET | ORAL | Status: DC

## 2015-03-11 MED ORDER — TRIAMCINOLONE ACETONIDE 40 MG/ML IJ SUSP
INTRAMUSCULAR | Status: AC
  Administered 2015-03-11: 12:00:00
  Filled 2015-03-11: qty 1

## 2015-03-11 MED ORDER — MORPHINE SULFATE ER 60 MG PO TBCR: EXTENDED_RELEASE_TABLET | ORAL | Status: DC

## 2015-03-11 MED ORDER — FENTANYL CITRATE (PF) 100 MCG/2ML IJ SOLN
INTRAMUSCULAR | Status: AC
  Administered 2015-03-11: 100 ug via INTRAVENOUS
  Filled 2015-03-11: qty 2

## 2015-03-11 MED ORDER — MIDAZOLAM HCL 5 MG/5ML IJ SOLN
INTRAMUSCULAR | Status: AC
  Administered 2015-03-11: 5 mg via INTRAVENOUS
  Filled 2015-03-11: qty 5

## 2015-03-11 NOTE — Patient Instructions (Addendum)
Continue present medications and antibiotics. Please obtain your antibiotic Ceftin today and begin taking antibiotic today As mentioned previously morphine sulfate extended release and oxycodone can cause respiratory depression, confusion, and other undesirable side effects as you well know. Please exercise extreme caution when taking these medications to avoid the side effects  F/U PCP Dr. Algis Liming  for evaliation of  BP and general medical  condition.  F/U surgical evaluation as discussed  F/U neurological evaluation.  May consider radiofrequency rhizolysis or intraspinal procedures pending response to present treatment and F/U evaluation.  Patient to call Pain Management Center should patient have concerns prior to scheduled return appointment.  Pain Management Discharge Instructions  General Discharge Instructions :  If you need to reach your doctor call: Monday-Friday 8:00 am - 4:00 pm at [phone] or toll free [phone].  After clinic hours [phone] to have operator reach doctor.  Bring all of your medication bottles to all your appointments in the pain clinic.  To cancel or reschedule your appointment with Pain Management please remember to call 24 hours in advance to avoid a fee.  Refer to the educational materials which you have been given on: General Risks, I had my Procedure. Discharge Instructions, Post Sedation.  Post Procedure Instructions:  The drugs you were given will stay in your system until tomorrow, so for the next 24 hours you should not drive, make any legal decisions or drink any alcoholic beverages.  You may eat anything you prefer, but it is better to start with liquids then soups and crackers, and gradually work up to solid foods.  Please notify your doctor immediately if you have any unusual bleeding, trouble breathing or pain that is not related to your normal pain.  Depending on the type of procedure that was done, some parts of your body may  feel week and/or numb.  This usually clears up by tonight or the next day.  Walk with the use of an assistive device or accompanied by an adult for the 24 hours.  You may use ice on the affected area for the first 24 hours.  Put ice in a Ziploc bag and cover with a towel and place against area 15 minutes on 15 minutes off.  You may switch to heat after 24 hours.Facet Joint Block The facet joints connect the bones of the spine (vertebrae). They make it possible for you to bend, twist, and make other movements with your spine. They also prevent you from overbending, overtwisting, and making other excessive movements.  A facet joint block is a procedure where a numbing medicine (anesthetic) is injected into a facet joint. Often, a type of anti-inflammatory medicine called a steroid is also injected. A facet joint block may be done for two reasons:   Diagnosis. A facet joint block may be done as a test to see whether neck or back pain is caused by a worn-down or infected facet joint. If the pain gets better after a facet joint block, it means the pain is probably coming from the facet joint. If the pain does not get better, it means the pain is probably not coming from the facet joint.   Therapy. A facet joint block may be done to relieve neck or back pain caused by a facet joint. A facet joint block is only done as a therapy if the pain does not improve with medicine, exercise programs, physical therapy, and other forms of pain management. LET [facility] CARE PROVIDER KNOW ABOUT:  Any allergies you have.   All medicines you are taking, including vitamins, herbs, eyedrops, and over-the-counter medicines and creams.   Previous problems you or members of your family have had with the use of anesthetics.   Any blood disorders you have had.   Other health problems you have. RISKS AND COMPLICATIONS Generally, having a facet joint block is safe. However, as with any procedure, complications can  occur. Possible complications associated with having a facet joint block include:   Bleeding.   Injury to a nerve near the injection site.   Pain at the injection site.   Weakness or numbness in areas controlled by nerves near the injection site.   Infection.   Temporary fluid retention.   Allergic reaction to anesthetics or medicines used during the procedure. BEFORE THE PROCEDURE   Follow your health care provider's instructions if you are taking dietary supplements or medicines. You may need to stop taking them or reduce your dosage.   Do not take any new dietary supplements or medicines without asking your health care provider first.   Follow your health care provider's instructions about eating and drinking before the procedure. You may need to stop eating and drinking several hours before the procedure.   Arrange to have an adult drive you home after the procedure. PROCEDURE  You may need to remove your clothing and dress in an open-back gown so that your health care provider can access your spine.   The procedure will be done while you are lying on an X-ray table. Most of the time you will be asked to lie on your stomach, but you may be asked to lie in a different position if an injection will be made in your neck.   Special machines will be used to monitor your oxygen levels, heart rate, and blood pressure.   If an injection will be made in your neck, an intravenous (IV) tube will be inserted into one of your veins. Fluids and medicine will flow directly into your body through the IV tube.   The area over the facet joint where the injection will be made will be cleaned with an antiseptic soap. The surrounding skin will be covered with sterile drapes.   An anesthetic will be applied to your skin to make the injection area numb. You may feel a temporary stinging or burning sensation.   A video X-ray machine will be used to locate the joint. A contrast dye  may be injected into the facet joint area to help with locating the joint.   When the joint is located, an anesthetic medicine will be injected into the joint through the needle.   Your health care provider will ask you whether you feel pain relief. If you do feel relief, a steroid may be injected to provide pain relief for a longer period of time. If you do not feel relief or feel only partial relief, additional injections of an anesthetic may be made in other facet joints.   The needle will be removed, the skin will be cleansed, and bandages will be applied.  AFTER THE PROCEDURE   You will be observed for 15-30 minutes before being allowed to go home. Do not drive. Have an adult drive you or take a taxi or public transportation instead.   If you feel pain relief, the pain will return in several hours or days when the anesthetic wears off.   You may feel pain relief 2-14 days after the procedure. The amount  of time this relief lasts varies from person to person.   It is normal to feel some tenderness over the injected area(s) for 2 days following the procedure.   If you have diabetes, you may have a temporary increase in blood sugar. Document Released: 12/09/2006 Document Revised: 12/04/2013 Document Reviewed: 05/09/2012 [facility] Patient Information 2015 [place], [place]. This information is not intended to replace advice given to you by your health care provider. Make sure you discuss any questions you have with your health care provider. Antibiotic at pharmacy for pick up
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
## 2015-03-11 NOTE — Progress Notes (Signed)
Safety precautions to be maintained throughout the outpatient stay will include: orient to surroundings, keep bed in low position, maintain call bell within reach at all times, provide assistance with transfer out of bed and ambulation.  

## 2015-03-12 ENCOUNTER — Telehealth: Payer: Self-pay | Admitting: *Deleted

## 2015-03-12 NOTE — Telephone Encounter (Signed)
No problems 

## 2015-04-11 ENCOUNTER — Ambulatory Visit: Payer: PPO | Attending: Pain Medicine | Admitting: Pain Medicine

## 2015-04-11 ENCOUNTER — Encounter: Payer: Self-pay | Admitting: Pain Medicine

## 2015-04-11 VITALS — BP 137/88 | HR 58 | Temp 98.3°F | Resp 18 | Ht 68.0 in | Wt 165.0 lb

## 2015-04-11 DIAGNOSIS — M545 Low back pain: Secondary | ICD-10-CM | POA: Diagnosis present

## 2015-04-11 DIAGNOSIS — M47812 Spondylosis without myelopathy or radiculopathy, cervical region: Secondary | ICD-10-CM

## 2015-04-11 DIAGNOSIS — M5481 Occipital neuralgia: Secondary | ICD-10-CM | POA: Diagnosis not present

## 2015-04-11 DIAGNOSIS — M5136 Other intervertebral disc degeneration, lumbar region: Secondary | ICD-10-CM | POA: Insufficient documentation

## 2015-04-11 DIAGNOSIS — M533 Sacrococcygeal disorders, not elsewhere classified: Secondary | ICD-10-CM | POA: Diagnosis not present

## 2015-04-11 DIAGNOSIS — Z981 Arthrodesis status: Secondary | ICD-10-CM

## 2015-04-11 DIAGNOSIS — M5030 Other cervical disc degeneration, unspecified cervical region: Secondary | ICD-10-CM

## 2015-04-11 DIAGNOSIS — M4806 Spinal stenosis, lumbar region: Secondary | ICD-10-CM | POA: Insufficient documentation

## 2015-04-11 DIAGNOSIS — M47816 Spondylosis without myelopathy or radiculopathy, lumbar region: Secondary | ICD-10-CM

## 2015-04-11 DIAGNOSIS — Z9889 Other specified postprocedural states: Secondary | ICD-10-CM | POA: Insufficient documentation

## 2015-04-11 DIAGNOSIS — G039 Meningitis, unspecified: Secondary | ICD-10-CM

## 2015-04-11 MED ORDER — OXYCODONE HCL 15 MG PO TABS: ORAL_TABLET | ORAL | Status: DC

## 2015-04-11 MED ORDER — MORPHINE SULFATE ER 60 MG PO TBCR: EXTENDED_RELEASE_TABLET | ORAL | Status: DC

## 2015-04-11 NOTE — Progress Notes (Signed)
Safety precautions to be maintained throughout the outpatient stay will include: orient to surroundings, keep bed in low position, maintain call bell within reach at all times, provide assistance with transfer out of bed and ambulation.  
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
## 2015-04-11 NOTE — Patient Instructions (Addendum)
PLAN   Continue present medication morphine sulfate extended release and oxycodone  Lumbar facets, medial branch nerves, blocks to be performed at time return appointment  F/U PCP Dr. Thedore Mins for evaliation of  BP Dr. Thedore Mins and general medical  condition  F/U surgical evaluation. May consider pending follow-up evaluations  F/U neurological evaluation. May consider pending follow-up evaluations  May consider radiofrequency rhizolysis or intraspinal procedures pending response to present treatment and F/U evaluation   Patient to call Pain Management Center should patient have concerns prior to scheduled return appointment. Pain Management Discharge Instructions  General Discharge Instructions :  If you need to reach your doctor call: Monday-Friday 8:00 am - 4:00 pm at [phone] or toll free [phone].  After clinic hours [phone] to have operator reach doctor.  Bring all of your medication bottles to all your appointments in the pain clinic.  To cancel or reschedule your appointment with Pain Management please remember to call 24 hours in advance to avoid a fee.  Refer to the educational materials which you have been given on: General Risks, I had my Procedure. Discharge Instructions, Post Sedation.  Post Procedure Instructions:  The drugs you were given will stay in your system until tomorrow, so for the next 24 hours you should not drive, make any legal decisions or drink any alcoholic beverages.  You may eat anything you prefer, but it is better to start with liquids then soups and crackers, and gradually work up to solid foods.  Please notify your doctor immediately if you have any unusual bleeding, trouble breathing or pain that is not related to your normal pain.  Depending on the type of procedure that was done, some parts of your body may feel week and/or numb.  This usually clears up by tonight or the next day.  Walk with the use of an assistive device or  accompanied by an adult for the 24 hours.  You may use ice on the affected area for the first 24 hours.  Put ice in a Ziploc bag and cover with a towel and place against area 15 minutes on 15 minutes off.  You may switch to heat after 24 hours.GENERAL RISKS AND COMPLICATIONS  What are the risk, side effects and possible complications? Generally speaking, most procedures are safe.  However, with any procedure there are risks, side effects, and the possibility of complications.  The risks and complications are dependent upon the sites that are lesioned, or the type of nerve block to be performed.  The closer the procedure is to the spine, the more serious the risks are.  Great care is taken when placing the radio frequency needles, block needles or lesioning probes, but sometimes complications can occur. 1. Infection: Any time there is an injection through the skin, there is a risk of infection.  This is why sterile conditions are used for these blocks.  There are four possible types of infection. 1. Localized skin infection. 2. Central Nervous System Infection-This can be in the form of Meningitis, which can be deadly. 3. Epidural Infections-This can be in the form of an epidural abscess, which can cause pressure inside of the spine, causing compression of the spinal cord with subsequent paralysis. This would require an emergency surgery to decompress, and there are no guarantees that the patient would recover from the paralysis. 4. Discitis-This is an infection of the intervertebral discs.  It occurs in about 1% of discography procedures.  It is difficult to treat and it may lead  to surgery.        2. Pain: the needles have to go through skin and soft tissues, will cause soreness.       3. Damage to internal structures:  The nerves to be lesioned may be near blood vessels or    other nerves which can be potentially damaged.       4. Bleeding: Bleeding is more common if the patient is taking blood  thinners such as  aspirin, Coumadin, Ticiid, Plavix, etc., or if he/she have some genetic predisposition  such as hemophilia. Bleeding into the spinal canal can cause compression of the spinal  cord with subsequent paralysis.  This would require an emergency surgery to  decompress and there are no guarantees that the patient would recover from the  paralysis.       5. Pneumothorax:  Puncturing of a lung is a possibility, every time a needle is introduced in  the area of the chest or upper back.  Pneumothorax refers to free air around the  collapsed lung(s), inside of the thoracic cavity (chest cavity).  Another two possible  complications related to a similar event would include: Hemothorax and Chylothorax.   These are variations of the Pneumothorax, where instead of air around the collapsed  lung(s), you may have blood or chyle, respectively.       6. Spinal headaches: They may occur with any procedures in the area of the spine.       7. Persistent CSF (Cerebro-Spinal Fluid) leakage: This is a rare problem, but may occur  with prolonged intrathecal or epidural catheters either due to the formation of a fistulous  track or a dural tear.       8. Nerve damage: By working so close to the spinal cord, there is always a possibility of  nerve damage, which could be as serious as a permanent spinal cord injury with  paralysis.       9. Death:  Although rare, severe deadly allergic reactions known as "Anaphylactic  reaction" can occur to any of the medications used.      10. Worsening of the symptoms:  We can always make thing worse.  What are the chances of something like this happening? Chances of any of this occuring are extremely low.  By statistics, you have more of a chance of getting killed in a motor vehicle accident: while driving to the hospital than any of the above occurring .  Nevertheless, you should be aware that they are possibilities.  In general, it is similar to taking a shower.  Everybody knows  that you can slip, hit your head and get killed.  Does that mean that you should not shower again?  Nevertheless always keep in mind that statistics do not mean anything if you happen to be on the wrong side of them.  Even if a procedure has a 1 (one) in a 1,000,000 (million) chance of going wrong, it you happen to be that one..Also, keep in mind that by statistics, you have more of a chance of having something go wrong when taking medications.  Who should not have this procedure? If you are on a blood thinning medication (e.g. Coumadin, Plavix, see list of "Blood Thinners"), or if you have an active infection going on, you should not have the procedure.  If you are taking any blood thinners, please inform your physician.  How should I prepare for this procedure?  Do not eat or drink anything at least six  hours prior to the procedure.  Bring a driver with you .  It cannot be a taxi.  Come accompanied by an adult that can drive you back, and that is strong enough to help you if your legs get weak or numb from the local anesthetic.  Take all of your medicines the morning of the procedure with just enough water to swallow them.  If you have diabetes, make sure that you are scheduled to have your procedure done first thing in the morning, whenever possible.  If you have diabetes, take only half of your insulin dose and notify our nurse that you have done so as soon as you arrive at the clinic.  If you are diabetic, but only take blood sugar pills (oral hypoglycemic), then do not take them on the morning of your procedure.  You may take them after you have had the procedure.  Do not take aspirin or any aspirin-containing medications, at least eleven (11) days prior to the procedure.  They may prolong bleeding.  Wear loose fitting clothing that may be easy to take off and that you would not mind if it got stained with Betadine or blood.  Do not wear any jewelry or perfume  Remove any nail  coloring.  It will interfere with some of our monitoring equipment.  NOTE: Remember that this is not meant to be interpreted as a complete list of all possible complications.  Unforeseen problems may occur.  BLOOD THINNERS The following drugs contain aspirin or other products, which can cause increased bleeding during surgery and should not be taken for 2 weeks prior to and 1 week after surgery.  If you should need take something for relief of minor pain, you may take acetaminophen which is found in Tylenol,m Datril, Anacin-3 and Panadol. It is not blood thinner. The products listed below are.  Do not take any of the products listed below in addition to any listed on your instruction sheet.  A.P.C or A.P.C with Codeine Codeine Phosphate Capsules #3 Ibuprofen Ridaura  ABC compound Congesprin Imuran rimadil  Advil Cope Indocin Robaxisal  Alka-Seltzer Effervescent Pain Reliever and Antacid Coricidin or Coricidin-D  Indomethacin Rufen  Alka-Seltzer plus Cold Medicine Cosprin Ketoprofen S-A-C Tablets  Anacin Analgesic Tablets or Capsules Coumadin Korlgesic Salflex  Anacin Extra Strength Analgesic tablets or capsules CP-2 Tablets Lanoril Salicylate  Anaprox Cuprimine Capsules Levenox Salocol  Anexsia-D Dalteparin Magan Salsalate  Anodynos Darvon compound Magnesium Salicylate Sine-off  Ansaid Dasin Capsules Magsal Sodium Salicylate  Anturane Depen Capsules Marnal Soma  APF Arthritis pain formula Dewitt's Pills Measurin Stanback  Argesic Dia-Gesic Meclofenamic Sulfinpyrazone  Arthritis Bayer Timed Release Aspirin Diclofenac Meclomen Sulindac  Arthritis pain formula Anacin Dicumarol Medipren Supac  Analgesic (Safety coated) Arthralgen Diffunasal Mefanamic Suprofen  Arthritis Strength Bufferin Dihydrocodeine Mepro Compound Suprol  Arthropan liquid Dopirydamole Methcarbomol with Aspirin Synalgos  ASA tablets/Enseals Disalcid Micrainin Tagament  Ascriptin Doan's Midol Talwin  Ascriptin A/D Dolene  Mobidin Tanderil  Ascriptin Extra Strength Dolobid Moblgesic Ticlid  Ascriptin with Codeine Doloprin or Doloprin with Codeine Momentum Tolectin  Asperbuf Duoprin Mono-gesic Trendar  Aspergum Duradyne Motrin or Motrin IB Triminicin  Aspirin plain, buffered or enteric coated Durasal Myochrisine Trigesic  Aspirin Suppositories Easprin Nalfon Trillsate  Aspirin with Codeine Ecotrin Regular or Extra Strength Naprosyn Uracel  Atromid-S Efficin Naproxen Ursinus  Auranofin Capsules Elmiron Neocylate Vanquish  Axotal Emagrin Norgesic Verin  Azathioprine Empirin or Empirin with Codeine Normiflo Vitamin E  Azolid Emprazil Nuprin Voltaren  Bayer Aspirin plain,  buffered or children's or timed BC Tablets or powders Encaprin Orgaran Warfarin Sodium  Buff-a-Comp Enoxaparin Orudis Zorpin  Buff-a-Comp with Codeine Equegesic Os-Cal-Gesic   Buffaprin Excedrin plain, buffered or Extra Strength Oxalid   Bufferin Arthritis Strength Feldene Oxphenbutazone   Bufferin plain or Extra Strength Feldene Capsules Oxycodone with Aspirin   Bufferin with Codeine Fenoprofen Fenoprofen Pabalate or Pabalate-SF   Buffets II Flogesic Panagesic   Buffinol plain or Extra Strength Florinal or Florinal with Codeine Panwarfarin   Buf-Tabs Flurbiprofen Penicillamine   Butalbital Compound Four-way cold tablets Penicillin   Butazolidin Fragmin Pepto-Bismol   Carbenicillin Geminisyn Percodan   Carna Arthritis Reliever Geopen Persantine   Carprofen Gold's salt Persistin   Chloramphenicol Goody's Phenylbutazone   Chloromycetin Haltrain Piroxlcam   Clmetidine heparin Plaquenil   Cllnoril Hyco-pap Ponstel   Clofibrate Hydroxy chloroquine Propoxyphen         Before stopping any of these medications, be sure to consult the physician who ordered them.  Some, such as Coumadin (Warfarin) are ordered to prevent or treat serious conditions such as "deep thrombosis", "pumonary embolisms", and other heart problems.  The amount of time that  you may need off of the medication may also vary with the medication and the reason for which you were taking it.  If you are taking any of these medications, please make sure you notify your pain physician before you undergo any procedures.         Facet Joint Block The facet joints connect the bones of the spine (vertebrae). They make it possible for you to bend, twist, and make other movements with your spine. They also prevent you from overbending, overtwisting, and making other excessive movements.  A facet joint block is a procedure where a numbing medicine (anesthetic) is injected into a facet joint. Often, a type of anti-inflammatory medicine called a steroid is also injected. A facet joint block may be done for two reasons:  2. Diagnosis. A facet joint block may be done as a test to see whether neck or back pain is caused by a worn-down or infected facet joint. If the pain gets better after a facet joint block, it means the pain is probably coming from the facet joint. If the pain does not get better, it means the pain is probably not coming from the facet joint.  3. Therapy. A facet joint block may be done to relieve neck or back pain caused by a facet joint. A facet joint block is only done as a therapy if the pain does not improve with medicine, exercise programs, physical therapy, and other forms of pain management. LET [facility] CARE PROVIDER KNOW ABOUT:   Any allergies you have.   All medicines you are taking, including vitamins, herbs, eyedrops, and over-the-counter medicines and creams.   Previous problems you or members of your family have had with the use of anesthetics.   Any blood disorders you have had.   Other health problems you have. RISKS AND COMPLICATIONS Generally, having a facet joint block is safe. However, as with any procedure, complications can occur. Possible complications associated with having a facet joint block include:   Bleeding.   Injury  to a nerve near the injection site.   Pain at the injection site.   Weakness or numbness in areas controlled by nerves near the injection site.   Infection.   Temporary fluid retention.   Allergic reaction to anesthetics or medicines used during the procedure. BEFORE THE PROCEDURE  Follow your health care provider's instructions if you are taking dietary supplements or medicines. You may need to stop taking them or reduce your dosage.   Do not take any new dietary supplements or medicines without asking your health care provider first.   Follow your health care provider's instructions about eating and drinking before the procedure. You may need to stop eating and drinking several hours before the procedure.   Arrange to have an adult drive you home after the procedure. PROCEDURE 12. You may need to remove your clothing and dress in an open-back gown so that your health care provider can access your spine.  13. The procedure will be done while you are lying on an X-ray table. Most of the time you will be asked to lie on your stomach, but you may be asked to lie in a different position if an injection will be made in your neck.  14. Special machines will be used to monitor your oxygen levels, heart rate, and blood pressure.  15. If an injection will be made in your neck, an intravenous (IV) tube will be inserted into one of your veins. Fluids and medicine will flow directly into your body through the IV tube.  16. The area over the facet joint where the injection will be made will be cleaned with an antiseptic soap. The surrounding skin will be covered with sterile drapes.  17. An anesthetic will be applied to your skin to make the injection area numb. You may feel a temporary stinging or burning sensation.  18. A video X-ray machine will be used to locate the joint. A contrast dye may be injected into the facet joint area to help with locating the joint.  19. When the joint  is located, an anesthetic medicine will be injected into the joint through the needle.  20. Your health care provider will ask you whether you feel pain relief. If you do feel relief, a steroid may be injected to provide pain relief for a longer period of time. If you do not feel relief or feel only partial relief, additional injections of an anesthetic may be made in other facet joints.  21. The needle will be removed, the skin will be cleansed, and bandages will be applied.  AFTER THE PROCEDURE   You will be observed for 15-30 minutes before being allowed to go home. Do not drive. Have an adult drive you or take a taxi or public transportation instead.   If you feel pain relief, the pain will return in several hours or days when the anesthetic wears off.   You may feel pain relief 2-14 days after the procedure. The amount of time this relief lasts varies from person to person.   It is normal to feel some tenderness over the injected area(s) for 2 days following the procedure.   If you have diabetes, you may have a temporary increase in blood sugar. Document Released: 12/09/2006 Document Revised: 12/04/2013 Document Reviewed: 05/09/2012 [facility] Patient Information 2015 [place], [place]. This information is not intended to replace advice given to you by your health care provider. Make sure you discuss any questions you have with your health care provider.

## 2015-04-22 ENCOUNTER — Ambulatory Visit: Payer: PPO | Attending: Pain Medicine | Admitting: Pain Medicine

## 2015-04-22 ENCOUNTER — Encounter: Payer: Self-pay | Admitting: Pain Medicine

## 2015-04-22 VITALS — BP 126/84 | HR 52 | Temp 97.6°F | Resp 16 | Ht 68.0 in | Wt 160.0 lb

## 2015-04-22 DIAGNOSIS — M51369 Other intervertebral disc degeneration, lumbar region without mention of lumbar back pain or lower extremity pain: Secondary | ICD-10-CM

## 2015-04-22 DIAGNOSIS — M5481 Occipital neuralgia: Secondary | ICD-10-CM

## 2015-04-22 DIAGNOSIS — M79605 Pain in left leg: Secondary | ICD-10-CM | POA: Diagnosis present

## 2015-04-22 DIAGNOSIS — M79604 Pain in right leg: Secondary | ICD-10-CM | POA: Diagnosis present

## 2015-04-22 DIAGNOSIS — M47812 Spondylosis without myelopathy or radiculopathy, cervical region: Secondary | ICD-10-CM

## 2015-04-22 DIAGNOSIS — Z9889 Other specified postprocedural states: Secondary | ICD-10-CM

## 2015-04-22 DIAGNOSIS — M5136 Other intervertebral disc degeneration, lumbar region: Secondary | ICD-10-CM

## 2015-04-22 DIAGNOSIS — M533 Sacrococcygeal disorders, not elsewhere classified: Secondary | ICD-10-CM

## 2015-04-22 DIAGNOSIS — M545 Low back pain: Secondary | ICD-10-CM | POA: Diagnosis present

## 2015-04-22 DIAGNOSIS — Z981 Arthrodesis status: Secondary | ICD-10-CM

## 2015-04-22 DIAGNOSIS — G039 Meningitis, unspecified: Secondary | ICD-10-CM

## 2015-04-22 DIAGNOSIS — M5030 Other cervical disc degeneration, unspecified cervical region: Secondary | ICD-10-CM

## 2015-04-22 DIAGNOSIS — M47816 Spondylosis without myelopathy or radiculopathy, lumbar region: Secondary | ICD-10-CM

## 2015-04-22 MED ORDER — BUPIVACAINE HCL (PF) 0.25 % IJ SOLN
INTRAMUSCULAR | Status: AC
  Administered 2015-04-22: 30 mL
  Filled 2015-04-22: qty 30

## 2015-04-22 MED ORDER — FENTANYL CITRATE (PF) 100 MCG/2ML IJ SOLN
INTRAMUSCULAR | Status: AC
  Administered 2015-04-22: 100 ug via INTRAVENOUS
  Filled 2015-04-22: qty 2

## 2015-04-22 MED ORDER — CEFUROXIME AXETIL 250 MG PO TABS: 250.0000 mg | ORAL_TABLET | Freq: Two times a day (BID) | ORAL | Status: DC

## 2015-04-22 MED ORDER — TRIAMCINOLONE ACETONIDE 40 MG/ML IJ SUSP
INTRAMUSCULAR | Status: AC
  Administered 2015-04-22: 40 mg
  Filled 2015-04-22: qty 1

## 2015-04-22 MED ORDER — MIDAZOLAM HCL 5 MG/5ML IJ SOLN
INTRAMUSCULAR | Status: AC
  Administered 2015-04-22: 3 mg via INTRAVENOUS
  Filled 2015-04-22: qty 5

## 2015-04-22 MED ORDER — ORPHENADRINE CITRATE 30 MG/ML IJ SOLN
INTRAMUSCULAR | Status: AC
  Administered 2015-04-22: 60 mg
  Filled 2015-04-22: qty 2

## 2015-04-22 MED ORDER — CEFAZOLIN SODIUM 1 G IJ SOLR
INTRAMUSCULAR | Status: AC
  Administered 2015-04-22: 1 g via INTRAVENOUS
  Filled 2015-04-22: qty 10

## 2015-04-22 NOTE — Patient Instructions (Addendum)
PLAN  Continue present medication oxycodone and MS Contin and begin taking antibiotic Ceftin Ceftin as prescribed. Please obtain your antibiotic today and begin taking antibiotic today  F/U PCP Dr.Singh  for evaliation of  BP and general medical  condition.  F/U surgical evaluation. Follow-up evaluation as discussed  F/U neurological evaluation. May consider pending follow-up evaluations  May consider radiofrequency rhizolysis or intraspinal procedures pending response to present treatment and F/U evaluation.  Patient to call [facility] should patient have concerns prior to scheduled return appointment.  Facet Blocks Patient Information  Description: The facets are joints in the spine between the vertebrae.  Like any joints in the body, facets can become irritated and painful.  Arthritis can also effect the facets.  By injecting steroids and local anesthetic in and around these joints, we can temporarily block the nerve supply to them.  Steroids act directly on irritated nerves and tissues to reduce selling and inflammation which often leads to decreased pain.  Facet blocks may be done anywhere along the spine from the neck to the low back depending upon the location of your pain.   After numbing the skin with local anesthetic (like Novocaine), a small needle is passed onto the facet joints under x-ray guidance.  You may experience a sensation of pressure while this is being done.  The entire block usually lasts about 15-25 minutes.   Conditions which may be treated by facet blocks:   Low back/buttock pain  Neck/shoulder pain  Certain types of headaches  Preparation for the injection:  1. Do not eat any solid food or dairy products within 6 hours of your appointment. 2. You may drink clear liquid up to 2 hours before appointment.  Clear liquids include water, black coffee, juice or soda.  No milk or cream please. 3. You may take your regular medication, including pain  medications, with a sip of water before your appointment.  Diabetics should hold regular insulin (if taken separately) and take 1/2 normal NPH dose the morning of the procedure.  Carry some sugar containing items with you to your appointment. 4. A driver must accompany you and be prepared to drive you home after your procedure. 5. Bring all your current medications with you. 6. An IV may be inserted and sedation may be given at the discretion of the physician. 7. A blood pressure cuff, EKG and other monitors will often be applied during the procedure.  Some patients may need to have extra oxygen administered for a short period. 8. You will be asked to provide medical information, including your allergies and medications, prior to the procedure.  We must know immediately if you are taking blood thinners (like Coumadin/Warfarin) or if you are allergic to IV iodine contrast (dye).  We must know if you could possible be pregnant.  Possible side-effects:   Bleeding from needle site  Infection (rare, may require surgery)  Nerve injury (rare)  Numbness & tingling (temporary)  Difficulty urinating (rare, temporary)  Spinal headache (a headache worse with upright posture)  Light-headedness (temporary)  Pain at injection site (serveral days)  Decreased blood pressure (rare, temporary)  Weakness in arm/leg (temporary)  Pressure sensation in back/neck (temporary)   Call if you experience:   Fever/chills associated with headache or increased back/neck pain  Headache worsened by an upright position  New onset, weakness or numbness of an extremity below the injection site  Hives or difficulty breathing (go to the emergency room)  Inflammation or drainage at the  injection site(s)  Severe back/neck pain greater than usual  New symptoms which are concerning to you  Please note:  Although the local anesthetic injected can often make your back or neck feel good for several hours after  the injection, the pain will likely return. It takes 3-7 days for steroids to work.  You may not notice any pain relief for at least one week.  If effective, we will often do a series of 2-3 injections spaced 3-6 weeks apart to maximally decrease your pain.  After the initial series, you may be a candidate for a more permanent nerve block of the facets.  If you have any questions, please call #[phone] [facility] Discharge Instructions  General Discharge Instructions :  If you need to reach your doctor call: Monday-Friday 8:00 am - 4:00 pm at [phone] or toll free [phone].  After clinic hours [phone] to have operator reach doctor.  Bring all of your medication bottles to all your appointments in the pain clinic.  To cancel or reschedule your appointment with Pain Management please remember to call 24 hours in advance to avoid a fee.  Refer to the educational materials which you have been given on: General Risks, I had my Procedure. Discharge Instructions, Post Sedation.  Post Procedure Instructions:  The drugs you were given will stay in your system until tomorrow, so for the next 24 hours you should not drive, make any legal decisions or drink any alcoholic beverages.  You may eat anything you prefer, but it is better to start with liquids then soups and crackers, and gradually work up to solid foods.  Please notify your doctor immediately if you have any unusual bleeding, trouble breathing or pain that is not related to your normal pain.  Depending on the type of procedure that was done, some parts of your body may feel week and/or numb.  This usually clears up by tonight or the next day.  Walk with the use of an assistive device or accompanied by an adult for the 24 hours.  You may use ice on the affected area for the first 24 hours.  Put ice in a Ziploc bag and cover with a towel and place against area 15 minutes  on 15 minutes off.  You may switch to heat after 24 hours.
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
## 2015-04-22 NOTE — Progress Notes (Signed)
Safety precautions to be maintained throughout the outpatient stay will include: orient to surroundings, keep bed in low position, maintain call bell within reach at all times, provide assistance with transfer out of bed and ambulation.  

## 2015-04-23 ENCOUNTER — Other Ambulatory Visit: Payer: Self-pay | Admitting: Pain Medicine

## 2015-04-23 ENCOUNTER — Telehealth: Payer: Self-pay | Admitting: *Deleted

## 2015-04-23 NOTE — Telephone Encounter (Signed)
States everything went well and that he has slept most of the day.Denies complications

## 2015-05-14 ENCOUNTER — Ambulatory Visit: Payer: PPO | Attending: Pain Medicine | Admitting: Pain Medicine

## 2015-05-14 VITALS — BP 135/81 | HR 58 | Temp 98.3°F | Resp 16 | Ht 68.0 in | Wt 160.0 lb

## 2015-05-14 DIAGNOSIS — Z87828 Personal history of other (healed) physical injury and trauma: Secondary | ICD-10-CM | POA: Diagnosis not present

## 2015-05-14 DIAGNOSIS — M533 Sacrococcygeal disorders, not elsewhere classified: Secondary | ICD-10-CM | POA: Insufficient documentation

## 2015-05-14 DIAGNOSIS — M5030 Other cervical disc degeneration, unspecified cervical region: Secondary | ICD-10-CM | POA: Diagnosis not present

## 2015-05-14 DIAGNOSIS — M545 Low back pain: Secondary | ICD-10-CM | POA: Diagnosis present

## 2015-05-14 DIAGNOSIS — M5136 Other intervertebral disc degeneration, lumbar region: Secondary | ICD-10-CM | POA: Insufficient documentation

## 2015-05-14 DIAGNOSIS — M5481 Occipital neuralgia: Secondary | ICD-10-CM

## 2015-05-14 DIAGNOSIS — M79604 Pain in right leg: Secondary | ICD-10-CM | POA: Diagnosis present

## 2015-05-14 DIAGNOSIS — G039 Meningitis, unspecified: Secondary | ICD-10-CM

## 2015-05-14 DIAGNOSIS — Z9889 Other specified postprocedural states: Secondary | ICD-10-CM | POA: Diagnosis not present

## 2015-05-14 DIAGNOSIS — M47812 Spondylosis without myelopathy or radiculopathy, cervical region: Secondary | ICD-10-CM

## 2015-05-14 DIAGNOSIS — M47816 Spondylosis without myelopathy or radiculopathy, lumbar region: Secondary | ICD-10-CM

## 2015-05-14 DIAGNOSIS — M79605 Pain in left leg: Secondary | ICD-10-CM | POA: Diagnosis present

## 2015-05-14 DIAGNOSIS — Z981 Arthrodesis status: Secondary | ICD-10-CM

## 2015-05-14 MED ORDER — MORPHINE SULFATE ER 60 MG PO TBCR: EXTENDED_RELEASE_TABLET | ORAL | Status: DC

## 2015-05-14 MED ORDER — OXYCODONE HCL 15 MG PO TABS: ORAL_TABLET | ORAL | Status: DC

## 2015-05-14 NOTE — Progress Notes (Signed)
Safety precautions to be maintained throughout the outpatient stay will include: orient to surroundings, keep bed in low position, maintain call bell within reach at all times, provide assistance with transfer out of bed and ambulation.  
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
## 2015-05-14 NOTE — Patient Instructions (Addendum)
PLAN   Continue present medication oxycodone and MS Contin   Lumbar facet, medial branch nerve blocks to be performed at time return appointment  F/U PCP Dr. Thedore Mins  or evaliation of  BP and general medical  condition  F/U surgical evaluation as discussed  F/U neurological evaluation. May consider pending follow-up evaluations  May consider radiofrequency rhizolysis or intraspinal procedures pending response to present treatment and F/U evaluation   Patient to call [facility] Center should patient have concerns prior to scheduled return appointment.

## 2015-05-29 ENCOUNTER — Ambulatory Visit: Payer: PPO | Attending: Pain Medicine | Admitting: Pain Medicine

## 2015-05-29 ENCOUNTER — Encounter: Payer: Self-pay | Admitting: Pain Medicine

## 2015-05-29 VITALS — BP 121/82 | HR 56 | Temp 98.2°F | Resp 16 | Ht 68.0 in | Wt 160.0 lb

## 2015-05-29 DIAGNOSIS — Z9889 Other specified postprocedural states: Secondary | ICD-10-CM

## 2015-05-29 DIAGNOSIS — M5136 Other intervertebral disc degeneration, lumbar region: Secondary | ICD-10-CM | POA: Diagnosis not present

## 2015-05-29 DIAGNOSIS — M5030 Other cervical disc degeneration, unspecified cervical region: Secondary | ICD-10-CM

## 2015-05-29 DIAGNOSIS — M545 Low back pain: Secondary | ICD-10-CM | POA: Insufficient documentation

## 2015-05-29 DIAGNOSIS — M47812 Spondylosis without myelopathy or radiculopathy, cervical region: Secondary | ICD-10-CM

## 2015-05-29 DIAGNOSIS — G039 Meningitis, unspecified: Secondary | ICD-10-CM

## 2015-05-29 DIAGNOSIS — M79669 Pain in unspecified lower leg: Secondary | ICD-10-CM | POA: Insufficient documentation

## 2015-05-29 DIAGNOSIS — M533 Sacrococcygeal disorders, not elsewhere classified: Secondary | ICD-10-CM

## 2015-05-29 DIAGNOSIS — M47816 Spondylosis without myelopathy or radiculopathy, lumbar region: Secondary | ICD-10-CM

## 2015-05-29 DIAGNOSIS — M4806 Spinal stenosis, lumbar region: Secondary | ICD-10-CM | POA: Diagnosis not present

## 2015-05-29 DIAGNOSIS — M5481 Occipital neuralgia: Secondary | ICD-10-CM

## 2015-05-29 DIAGNOSIS — Z981 Arthrodesis status: Secondary | ICD-10-CM

## 2015-05-29 DIAGNOSIS — M51369 Other intervertebral disc degeneration, lumbar region without mention of lumbar back pain or lower extremity pain: Secondary | ICD-10-CM

## 2015-05-29 MED ORDER — LACTATED RINGERS IV SOLN: 1000.0000 mL | INTRAVENOUS | Status: DC

## 2015-05-29 MED ORDER — LIDOCAINE HCL (PF) 1 % IJ SOLN: 10.0000 mL | Freq: Once | INTRAMUSCULAR | Status: DC

## 2015-05-29 MED ORDER — ORPHENADRINE CITRATE 30 MG/ML IJ SOLN
INTRAMUSCULAR | Status: AC
  Administered 2015-05-29: 60 mg via INTRAMUSCULAR
  Filled 2015-05-29: qty 2

## 2015-05-29 MED ORDER — ORPHENADRINE CITRATE 30 MG/ML IJ SOLN
60.0000 mg | Freq: Once | INTRAMUSCULAR | Status: AC
  Administered 2015-05-29: 60 mg via INTRAMUSCULAR

## 2015-05-29 MED ORDER — FENTANYL CITRATE (PF) 100 MCG/2ML IJ SOLN
100.0000 ug | Freq: Once | INTRAMUSCULAR | Status: AC
  Administered 2015-05-29: 100 ug via INTRAVENOUS

## 2015-05-29 MED ORDER — CEFUROXIME AXETIL 250 MG PO TABS: 250.0000 mg | ORAL_TABLET | Freq: Two times a day (BID) | ORAL | Status: DC

## 2015-05-29 MED ORDER — CEFAZOLIN SODIUM 1-5 GM-% IV SOLN: 1.0000 g | Freq: Once | INTRAVENOUS | Status: DC

## 2015-05-29 MED ORDER — MIDAZOLAM HCL 5 MG/5ML IJ SOLN
INTRAMUSCULAR | Status: AC
  Administered 2015-05-29: 3 mg via INTRAVENOUS
  Filled 2015-05-29: qty 5

## 2015-05-29 MED ORDER — TRIAMCINOLONE ACETONIDE 40 MG/ML IJ SUSP: 40.0000 mg | Freq: Once | INTRAMUSCULAR | Status: DC

## 2015-05-29 MED ORDER — BUPIVACAINE HCL (PF) 0.25 % IJ SOLN
INTRAMUSCULAR | Status: AC
  Administered 2015-05-29: 30 mL
  Filled 2015-05-29: qty 30

## 2015-05-29 MED ORDER — BUPIVACAINE HCL (PF) 0.25 % IJ SOLN
30.0000 mL | Freq: Once | INTRAMUSCULAR | Status: AC
  Administered 2015-05-29: 30 mL

## 2015-05-29 MED ORDER — FENTANYL CITRATE (PF) 100 MCG/2ML IJ SOLN
INTRAMUSCULAR | Status: AC
  Administered 2015-05-29: 100 ug via INTRAVENOUS
  Filled 2015-05-29: qty 2

## 2015-05-29 MED ORDER — CEFAZOLIN SODIUM 1 G IJ SOLR
INTRAMUSCULAR | Status: AC
  Administered 2015-05-29: 1 g via INTRAVENOUS
  Filled 2015-05-29: qty 10

## 2015-05-29 MED ORDER — TRIAMCINOLONE ACETONIDE 40 MG/ML IJ SUSP
INTRAMUSCULAR | Status: AC
  Administered 2015-05-29: 40 mg
  Filled 2015-05-29: qty 1
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
## 2015-05-29 NOTE — Patient Instructions (Addendum)
PLAN   Continue present medication oxycodone and MS Contin and begin taking Ceftin antibiotic today as prescribed  F/U PCP Dr. Thedore Mins  or evaliation of  BP and general medical  condition  F/U surgical evaluation as discussed  F/U neurological evaluation. May consider pending follow-up evaluations  May consider radiofrequency rhizolysis or intraspinal procedures pending response to present treatment and F/U evaluation   Patient to call Pain Management Center should patient have concerns prior to scheduled return appointment.Pain Management Discharge Instructions  General Discharge Instructions :  If you need to reach your doctor call: Monday-Friday 8:00 am - 4:00 pm at [phone] or toll free [phone].  After clinic hours [phone] to have operator reach doctor.  Bring all of your medication bottles to all your appointments in the pain clinic.  To cancel or reschedule your appointment with Pain Management please remember to call 24 hours in advance to avoid a fee.  Refer to the educational materials which you have been given on: General Risks, I had my Procedure. Discharge Instructions, Post Sedation.  Post Procedure Instructions:  The drugs you were given will stay in your system until tomorrow, so for the next 24 hours you should not drive, make any legal decisions or drink any alcoholic beverages.  You may eat anything you prefer, but it is better to start with liquids then soups and crackers, and gradually work up to solid foods.  Please notify your doctor immediately if you have any unusual bleeding, trouble breathing or pain that is not related to your normal pain.  Depending on the type of procedure that was done, some parts of your body may feel week and/or numb.  This usually clears up by tonight or the next day.  Walk with the use of an assistive device or accompanied by an adult for the 24 hours.  You may use ice on the affected area for the first 24 hours.   Put ice in a Ziploc bag and cover with a towel and place against area 15 minutes on 15 minutes off.  You may switch to heat after 24 hours.GENERAL RISKS AND COMPLICATIONS  What are the risk, side effects and possible complications? Generally speaking, most procedures are safe.  However, with any procedure there are risks, side effects, and the possibility of complications.  The risks and complications are dependent upon the sites that are lesioned, or the type of nerve block to be performed.  The closer the procedure is to the spine, the more serious the risks are.  Great care is taken when placing the radio frequency needles, block needles or lesioning probes, but sometimes complications can occur. 1. Infection: Any time there is an injection through the skin, there is a risk of infection.  This is why sterile conditions are used for these blocks.  There are four possible types of infection. 1. Localized skin infection. 2. Central Nervous System Infection-This can be in the form of Meningitis, which can be deadly. 3. Epidural Infections-This can be in the form of an epidural abscess, which can cause pressure inside of the spine, causing compression of the spinal cord with subsequent paralysis. This would require an emergency surgery to decompress, and there are no guarantees that the patient would recover from the paralysis. 4. Discitis-This is an infection of the intervertebral discs.  It occurs in about 1% of discography procedures.  It is difficult to treat and it may lead to surgery.        2. Pain: the needles  have to go through skin and soft tissues, will cause soreness.       3. Damage to internal structures:  The nerves to be lesioned may be near blood vessels or    other nerves which can be potentially damaged.       4. Bleeding: Bleeding is more common if the patient is taking blood thinners such as  aspirin, Coumadin, Ticiid, Plavix, etc., or if he/she have some genetic predisposition  such  as hemophilia. Bleeding into the spinal canal can cause compression of the spinal  cord with subsequent paralysis.  This would require an emergency surgery to  decompress and there are no guarantees that the patient would recover from the  paralysis.       5. Pneumothorax:  Puncturing of a lung is a possibility, every time a needle is introduced in  the area of the chest or upper back.  Pneumothorax refers to free air around the  collapsed lung(s), inside of the thoracic cavity (chest cavity).  Another two possible  complications related to a similar event would include: Hemothorax and Chylothorax.   These are variations of the Pneumothorax, where instead of air around the collapsed  lung(s), you may have blood or chyle, respectively.       6. Spinal headaches: They may occur with any procedures in the area of the spine.       7. Persistent CSF (Cerebro-Spinal Fluid) leakage: This is a rare problem, but may occur  with prolonged intrathecal or epidural catheters either due to the formation of a fistulous  track or a dural tear.       8. Nerve damage: By working so close to the spinal cord, there is always a possibility of  nerve damage, which could be as serious as a permanent spinal cord injury with  paralysis.       9. Death:  Although rare, severe deadly allergic reactions known as "Anaphylactic  reaction" can occur to any of the medications used.      10. Worsening of the symptoms:  We can always make thing worse.  What are the chances of something like this happening? Chances of any of this occuring are extremely low.  By statistics, you have more of a chance of getting killed in a motor vehicle accident: while driving to the hospital than any of the above occurring .  Nevertheless, you should be aware that they are possibilities.  In general, it is similar to taking a shower.  Everybody knows that you can slip, hit your head and get killed.  Does that mean that you should not shower again?   Nevertheless always keep in mind that statistics do not mean anything if you happen to be on the wrong side of them.  Even if a procedure has a 1 (one) in a 1,000,000 (million) chance of going wrong, it you happen to be that one..Also, keep in mind that by statistics, you have more of a chance of having something go wrong when taking medications.  Who should not have this procedure? If you are on a blood thinning medication (e.g. Coumadin, Plavix, see list of "Blood Thinners"), or if you have an active infection going on, you should not have the procedure.  If you are taking any blood thinners, please inform your physician.  How should I prepare for this procedure?  Do not eat or drink anything at least six hours prior to the procedure.  Bring a driver with you .  It cannot be a taxi.  Come accompanied by an adult that can drive you back, and that is strong enough to help you if your legs get weak or numb from the local anesthetic.  Take all of your medicines the morning of the procedure with just enough water to swallow them.  If you have diabetes, make sure that you are scheduled to have your procedure done first thing in the morning, whenever possible.  If you have diabetes, take only half of your insulin dose and notify our nurse that you have done so as soon as you arrive at the clinic.  If you are diabetic, but only take blood sugar pills (oral hypoglycemic), then do not take them on the morning of your procedure.  You may take them after you have had the procedure.  Do not take aspirin or any aspirin-containing medications, at least eleven (11) days prior to the procedure.  They may prolong bleeding.  Wear loose fitting clothing that may be easy to take off and that you would not mind if it got stained with Betadine or blood.  Do not wear any jewelry or perfume  Remove any nail coloring.  It will interfere with some of our monitoring equipment.  NOTE: Remember that this is not  meant to be interpreted as a complete list of all possible complications.  Unforeseen problems may occur.  BLOOD THINNERS The following drugs contain aspirin or other products, which can cause increased bleeding during surgery and should not be taken for 2 weeks prior to and 1 week after surgery.  If you should need take something for relief of minor pain, you may take acetaminophen which is found in Tylenol,m Datril, Anacin-3 and Panadol. It is not blood thinner. The products listed below are.  Do not take any of the products listed below in addition to any listed on your instruction sheet.  A.P.C or A.P.C with Codeine Codeine Phosphate Capsules #3 Ibuprofen Ridaura  ABC compound Congesprin Imuran rimadil  Advil Cope Indocin Robaxisal  Alka-Seltzer Effervescent Pain Reliever and Antacid Coricidin or Coricidin-D  Indomethacin Rufen  Alka-Seltzer plus Cold Medicine Cosprin Ketoprofen S-A-C Tablets  Anacin Analgesic Tablets or Capsules Coumadin Korlgesic Salflex  Anacin Extra Strength Analgesic tablets or capsules CP-2 Tablets Lanoril Salicylate  Anaprox Cuprimine Capsules Levenox Salocol  Anexsia-D Dalteparin Magan Salsalate  Anodynos Darvon compound Magnesium Salicylate Sine-off  Ansaid Dasin Capsules Magsal Sodium Salicylate  Anturane Depen Capsules Marnal Soma  APF Arthritis pain formula Dewitt's Pills Measurin Stanback  Argesic Dia-Gesic Meclofenamic Sulfinpyrazone  Arthritis Bayer Timed Release Aspirin Diclofenac Meclomen Sulindac  Arthritis pain formula Anacin Dicumarol Medipren Supac  Analgesic (Safety coated) Arthralgen Diffunasal Mefanamic Suprofen  Arthritis Strength Bufferin Dihydrocodeine Mepro Compound Suprol  Arthropan liquid Dopirydamole Methcarbomol with Aspirin Synalgos  ASA tablets/Enseals Disalcid Micrainin Tagament  Ascriptin Doan's Midol Talwin  Ascriptin A/D Dolene Mobidin Tanderil  Ascriptin Extra Strength Dolobid Moblgesic Ticlid  Ascriptin with Codeine Doloprin or  Doloprin with Codeine Momentum Tolectin  Asperbuf Duoprin Mono-gesic Trendar  Aspergum Duradyne Motrin or Motrin IB Triminicin  Aspirin plain, buffered or enteric coated Durasal Myochrisine Trigesic  Aspirin Suppositories Easprin Nalfon Trillsate  Aspirin with Codeine Ecotrin Regular or Extra Strength Naprosyn Uracel  Atromid-S Efficin Naproxen Ursinus  Auranofin Capsules Elmiron Neocylate Vanquish  Axotal Emagrin Norgesic Verin  Azathioprine Empirin or Empirin with Codeine Normiflo Vitamin E  Azolid Emprazil Nuprin Voltaren  Bayer Aspirin plain, buffered or children's or timed BC Tablets or powders Encaprin Orgaran Warfarin Sodium  Buff-a-Comp Enoxaparin Orudis Zorpin  Buff-a-Comp with Codeine Equegesic Os-Cal-Gesic   Buffaprin Excedrin plain, buffered or Extra Strength Oxalid   Bufferin Arthritis Strength Feldene Oxphenbutazone   Bufferin plain or Extra Strength Feldene Capsules Oxycodone with Aspirin   Bufferin with Codeine Fenoprofen Fenoprofen Pabalate or Pabalate-SF   Buffets II Flogesic Panagesic   Buffinol plain or Extra Strength Florinal or Florinal with Codeine Panwarfarin   Buf-Tabs Flurbiprofen Penicillamine   Butalbital Compound Four-way cold tablets Penicillin   Butazolidin Fragmin Pepto-Bismol   Carbenicillin Geminisyn Percodan   Carna Arthritis Reliever Geopen Persantine   Carprofen Gold's salt Persistin   Chloramphenicol Goody's Phenylbutazone   Chloromycetin Haltrain Piroxlcam   Clmetidine heparin Plaquenil   Cllnoril Hyco-pap Ponstel   Clofibrate Hydroxy chloroquine Propoxyphen         Before stopping any of these medications, be sure to consult the physician who ordered them.  Some, such as Coumadin (Warfarin) are ordered to prevent or treat serious conditions such as "deep thrombosis", "pumonary embolisms", and other heart problems.  The amount of time that you may need off of the medication may also vary with the medication and the reason for which you were  taking it.  If you are taking any of these medications, please make sure you notify your pain physician before you undergo any procedures.         Facet Joint Block, Care After Refer to this sheet in the next few weeks. These instructions provide you with information on caring for yourself after your procedure. Your health care provider may also give you more specific instructions. Your treatment has been planned according to current medical practices, but problems sometimes occur. Call your health care provider if you have any problems or questions after your procedure. HOME CARE INSTRUCTIONS  2. Keep track of the amount of pain relief you feel and how long it lasts. 3. Limit pain medicine within the first 4-6 hours after the procedure as directed by your health care provider. 4. Resume taking dietary supplements and medicines as directed by your health care provider. 5. You may resume your regular diet. 6. Do not apply heat near or over the injection site(s) for 24 hours.  7. Do not take a bath or soak in water (such as a pool or lake) for 24 hours. 8. Do not drive for 24 hours unless approved by your health care provider. 9. Avoid strenuous activity for 24 hours. 10. Remove your bandages the morning after the procedure.  11. If the injection site is tender, applying an ice pack may relieve some tenderness. To do this: 1. Put ice in a bag. 2. Place a towel between your skin and the bag. 3. Leave the ice on for 15-20 minutes, 3-4 times a day. 12. Keep follow-up appointments as directed by your health care provider. SEEK MEDICAL CARE IF:   Your pain is not controlled by your medicines.   There is drainage from the injection site.   There is significant bleeding or swelling at the injection site.  You have diabetes and your blood sugar is above 180 mg/dL. SEEK IMMEDIATE MEDICAL CARE IF:   You develop a fever of 101F (38.3C) or greater.   You have worsening pain or  swelling around the injection site.   You have red streaking around the injection site.   You develop severe pain that is not controlled by your medicines.   You develop a headache, stiff neck, nausea, or vomiting.   Your eyes become  very sensitive to light.   You have weakness, paralysis, or tingling in your arms or legs that was not present before the procedure.   You develop difficulty urinating or breathing.    This information is not intended to replace advice given to you by your health care provider. Make sure you discuss any questions you have with your health care provider.   Document Released: 07/06/2012 Document Revised: 08/10/2014 Document Reviewed: 07/06/2012 Elsevier Interactive Patient Education 2016 [employer] Facet Joint Block The facet joints connect the bones of the spine (vertebrae). They make it possible for you to bend, twist, and make other movements with your spine. They also prevent you from overbending, overtwisting, and making other excessive movements.  A facet joint block is a procedure where a numbing medicine (anesthetic) is injected into a facet joint. Often, a type of anti-inflammatory medicine called a steroid is also injected. A facet joint block may be done for two reasons:  13. Diagnosis. A facet joint block may be done as a test to see whether neck or back pain is caused by a worn-down or infected facet joint. If the pain gets better after a facet joint block, it means the pain is probably coming from the facet joint. If the pain does not get better, it means the pain is probably not coming from the facet joint.  14. Therapy. A facet joint block may be done to relieve neck or back pain caused by a facet joint. A facet joint block is only done as a therapy if the pain does not improve with medicine, exercise programs, physical therapy, and other forms of pain management. LET [facility] PROVIDER KNOW ABOUT:   Any allergies you have.    All medicines you are taking, including vitamins, herbs, eyedrops, and over-the-counter medicines and creams.   Previous problems you or members of your family have had with the use of anesthetics.   Any blood disorders you have had.   Other health problems you have. RISKS AND COMPLICATIONS Generally, having a facet joint block is safe. However, as with any procedure, complications can occur. Possible complications associated with having a facet joint block include:   Bleeding.   Injury to a nerve near the injection site.   Pain at the injection site.   Weakness or numbness in areas controlled by nerves near the injection site.   Infection.   Temporary fluid retention.   Allergic reaction to anesthetics or medicines used during the procedure. BEFORE THE PROCEDURE   Follow your health care provider's instructions if you are taking dietary supplements or medicines. You may need to stop taking them or reduce your dosage.   Do not take any new dietary supplements or medicines without asking your health care provider first.   Follow your health care provider's instructions about eating and drinking before the procedure. You may need to stop eating and drinking several hours before the procedure.   Arrange to have an adult drive you home after the procedure. PROCEDURE 12. You may need to remove your clothing and dress in an open-back gown so that your health care provider can access your spine.  13. The procedure will be done while you are lying on an X-ray table. Most of the time you will be asked to lie on your stomach, but you may be asked to lie in a different position if an injection will be made in your neck.  14. Special machines will be used to monitor your oxygen  levels, heart rate, and blood pressure.  15. If an injection will be made in your neck, an intravenous (IV) tube will be inserted into one of your veins. Fluids and medicine will flow directly into  your body through the IV tube.  16. The area over the facet joint where the injection will be made will be cleaned with an antiseptic soap. The surrounding skin will be covered with sterile drapes.  17. An anesthetic will be applied to your skin to make the injection area numb. You may feel a temporary stinging or burning sensation.  18. A video X-ray machine will be used to locate the joint. A contrast dye may be injected into the facet joint area to help with locating the joint.  19. When the joint is located, an anesthetic medicine will be injected into the joint through the needle.  20. Your health care provider will ask you whether you feel pain relief. If you do feel relief, a steroid may be injected to provide pain relief for a longer period of time. If you do not feel relief or feel only partial relief, additional injections of an anesthetic may be made in other facet joints.  21. The needle will be removed, the skin will be cleansed, and bandages will be applied.  AFTER THE PROCEDURE   You will be observed for 15-30 minutes before being allowed to go home. Do not drive. Have an adult drive you or take a taxi or public transportation instead.   If you feel pain relief, the pain will return in several hours or days when the anesthetic wears off.   You may feel pain relief 2-14 days after the procedure. The amount of time this relief lasts varies from person to person.   It is normal to feel some tenderness over the injected area(s) for 2 days following the procedure.   If you have diabetes, you may have a temporary increase in blood sugar.   This information is not intended to replace advice given to you by your health care provider. Make sure you discuss any questions you have with your health care provider.   Document Released: 12/09/2006 Document Revised: 08/10/2014 Document Reviewed: 05/09/2012 Elsevier Interactive Patient Education [employer]

## 2015-05-29 NOTE — Progress Notes (Signed)
Safety precautions to be maintained throughout the outpatient stay will include: orient to surroundings, keep bed in low position, maintain call bell within reach at all times, provide assistance with transfer out of bed and ambulation.  

## 2015-05-30 ENCOUNTER — Telehealth: Payer: Self-pay | Admitting: *Deleted

## 2015-05-30 NOTE — Telephone Encounter (Signed)
Spoke with patient verbalizes no problems or concerns other than soreness at the site.  Encouraged patient to use ice and heat alternately for soreness and this should improve.  If it does not or he is concerned he may call our office.

## 2015-06-13 ENCOUNTER — Ambulatory Visit: Payer: PPO | Attending: Pain Medicine | Admitting: Pain Medicine

## 2015-06-13 ENCOUNTER — Encounter: Payer: Self-pay | Admitting: Pain Medicine

## 2015-06-13 VITALS — BP 122/82 | HR 67 | Temp 98.2°F | Resp 18 | Ht 68.0 in | Wt 160.0 lb

## 2015-06-13 DIAGNOSIS — M5136 Other intervertebral disc degeneration, lumbar region: Secondary | ICD-10-CM | POA: Diagnosis not present

## 2015-06-13 DIAGNOSIS — M4806 Spinal stenosis, lumbar region: Secondary | ICD-10-CM | POA: Diagnosis not present

## 2015-06-13 DIAGNOSIS — M533 Sacrococcygeal disorders, not elsewhere classified: Secondary | ICD-10-CM

## 2015-06-13 DIAGNOSIS — M5481 Occipital neuralgia: Secondary | ICD-10-CM

## 2015-06-13 DIAGNOSIS — M545 Low back pain: Secondary | ICD-10-CM | POA: Diagnosis present

## 2015-06-13 DIAGNOSIS — Z87828 Personal history of other (healed) physical injury and trauma: Secondary | ICD-10-CM | POA: Diagnosis not present

## 2015-06-13 DIAGNOSIS — M5030 Other cervical disc degeneration, unspecified cervical region: Secondary | ICD-10-CM | POA: Diagnosis not present

## 2015-06-13 DIAGNOSIS — G039 Meningitis, unspecified: Secondary | ICD-10-CM

## 2015-06-13 DIAGNOSIS — M79604 Pain in right leg: Secondary | ICD-10-CM | POA: Diagnosis present

## 2015-06-13 DIAGNOSIS — M47812 Spondylosis without myelopathy or radiculopathy, cervical region: Secondary | ICD-10-CM

## 2015-06-13 DIAGNOSIS — Z981 Arthrodesis status: Secondary | ICD-10-CM

## 2015-06-13 DIAGNOSIS — M47816 Spondylosis without myelopathy or radiculopathy, lumbar region: Secondary | ICD-10-CM

## 2015-06-13 DIAGNOSIS — Z9889 Other specified postprocedural states: Secondary | ICD-10-CM

## 2015-06-13 DIAGNOSIS — M79605 Pain in left leg: Secondary | ICD-10-CM | POA: Diagnosis present

## 2015-06-13 DIAGNOSIS — M51369 Other intervertebral disc degeneration, lumbar region without mention of lumbar back pain or lower extremity pain: Secondary | ICD-10-CM

## 2015-06-13 MED ORDER — OXYCODONE HCL 15 MG PO TABS: ORAL_TABLET | ORAL | Status: DC

## 2015-06-13 MED ORDER — MORPHINE SULFATE ER 60 MG PO TBCR: EXTENDED_RELEASE_TABLET | ORAL | Status: DC
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
## 2015-06-13 NOTE — Progress Notes (Signed)
Safety precautions to be maintained throughout the outpatient stay will include: orient to surroundings, keep bed in low position, maintain call bell within reach at all times, provide assistance with transfer out of bed and ambulation.  

## 2015-06-13 NOTE — Patient Instructions (Addendum)
PLAN Continue present medication oxycodone and MS Contin   Block of nerves of the sacroiliac joints to be performed at time return appointment  F/U PCP Dr. Thedore MinsSingh  or evaliation of  BP and general medical  condition  F/U surgical evaluation as discussed  F/U neurological evaluation. May consider pending follow-up evaluations  May consider radiofrequency rhizolysis or intraspinal procedures pending response to present treatment and F/U evaluation   Patient to call [facility] should patient have concerns prior to scheduled return appointment.Pain Management Discharge Instructions  General Discharge Instructions :  If you need to reach your doctor call: Monday-Friday 8:00 am - 4:00 pm at [phone] or toll free [phone].  After clinic hours [phone] to have operator reach doctor.  Bring all of your medication bottles to all your appointments in the pain clinic.  To cancel or reschedule your appointment with Pain Management please remember to call 24 hours in advance to avoid a fee.  Refer to the educational materials which you have been given on: General Risks, I had my Procedure. Discharge Instructions, Post Sedation.  Post Procedure Instructions:  The drugs you were given will stay in your system until tomorrow, so for the next 24 hours you should not drive, make any legal decisions or drink any alcoholic beverages.  You may eat anything you prefer, but it is better to start with liquids then soups and crackers, and gradually work up to solid foods.  Please notify your doctor immediately if you have any unusual bleeding, trouble breathing or pain that is not related to your normal pain.  Depending on the type of procedure that was done, some parts of your body may feel week and/or numb.  This usually clears up by tonight or the next day.  Walk with the use of an assistive device or accompanied by an adult for the 24 hours.  You may use ice on the affected  area for the first 24 hours.  Put ice in a Ziploc bag and cover with a towel and place against area 15 minutes on 15 minutes off.  You may switch to heat after 24 hours.GENERAL RISKS AND COMPLICATIONS  What are the risk, side effects and possible complications? Generally speaking, most procedures are safe.  However, with any procedure there are risks, side effects, and the possibility of complications.  The risks and complications are dependent upon the sites that are lesioned, or the type of nerve block to be performed.  The closer the procedure is to the spine, the more serious the risks are.  Great care is taken when placing the radio frequency needles, block needles or lesioning probes, but sometimes complications can occur. 1. Infection: Any time there is an injection through the skin, there is a risk of infection.  This is why sterile conditions are used for these blocks.  There are four possible types of infection. 1. Localized skin infection. 2. Central Nervous System Infection-This can be in the form of Meningitis, which can be deadly. 3. Epidural Infections-This can be in the form of an epidural abscess, which can cause pressure inside of the spine, causing compression of the spinal cord with subsequent paralysis. This would require an emergency surgery to decompress, and there are no guarantees that the patient would recover from the paralysis. 4. Discitis-This is an infection of the intervertebral discs.  It occurs in about 1% of discography procedures.  It is difficult to treat and it may lead to surgery.  2. Pain: the needles have to go through skin and soft tissues, will cause soreness.       3. Damage to internal structures:  The nerves to be lesioned may be near blood vessels or    other nerves which can be potentially damaged.       4. Bleeding: Bleeding is more common if the patient is taking blood thinners such as  aspirin, Coumadin, Ticiid, Plavix, etc., or if he/she have some  genetic predisposition  such as hemophilia. Bleeding into the spinal canal can cause compression of the spinal  cord with subsequent paralysis.  This would require an emergency surgery to  decompress and there are no guarantees that the patient would recover from the  paralysis.       5. Pneumothorax:  Puncturing of a lung is a possibility, every time a needle is introduced in  the area of the chest or upper back.  Pneumothorax refers to free air around the  collapsed lung(s), inside of the thoracic cavity (chest cavity).  Another two possible  complications related to a similar event would include: Hemothorax and Chylothorax.   These are variations of the Pneumothorax, where instead of air around the collapsed  lung(s), you may have blood or chyle, respectively.       6. Spinal headaches: They may occur with any procedures in the area of the spine.       7. Persistent CSF (Cerebro-Spinal Fluid) leakage: This is a rare problem, but may occur  with prolonged intrathecal or epidural catheters either due to the formation of a fistulous  track or a dural tear.       8. Nerve damage: By working so close to the spinal cord, there is always a possibility of  nerve damage, which could be as serious as a permanent spinal cord injury with  paralysis.       9. Death:  Although rare, severe deadly allergic reactions known as "Anaphylactic  reaction" can occur to any of the medications used.      10. Worsening of the symptoms:  We can always make thing worse.  What are the chances of something like this happening? Chances of any of this occuring are extremely low.  By statistics, you have more of a chance of getting killed in a motor vehicle accident: while driving to the hospital than any of the above occurring .  Nevertheless, you should be aware that they are possibilities.  In general, it is similar to taking a shower.  Everybody knows that you can slip, hit your head and get killed.  Does that mean that you should  not shower again?  Nevertheless always keep in mind that statistics do not mean anything if you happen to be on the wrong side of them.  Even if a procedure has a 1 (one) in a 1,000,000 (million) chance of going wrong, it you happen to be that one..Also, keep in mind that by statistics, you have more of a chance of having something go wrong when taking medications.  Who should not have this procedure? If you are on a blood thinning medication (e.g. Coumadin, Plavix, see list of "Blood Thinners"), or if you have an active infection going on, you should not have the procedure.  If you are taking any blood thinners, please inform your physician.  How should I prepare for this procedure?  Do not eat or drink anything at least six hours prior to the procedure.  Bring a driver  with you .  It cannot be a taxi.  Come accompanied by an adult that can drive you back, and that is strong enough to help you if your legs get weak or numb from the local anesthetic.  Take all of your medicines the morning of the procedure with just enough water to swallow them.  If you have diabetes, make sure that you are scheduled to have your procedure done first thing in the morning, whenever possible.  If you have diabetes, take only half of your insulin dose and notify our nurse that you have done so as soon as you arrive at the clinic.  If you are diabetic, but only take blood sugar pills (oral hypoglycemic), then do not take them on the morning of your procedure.  You may take them after you have had the procedure.  Do not take aspirin or any aspirin-containing medications, at least eleven (11) days prior to the procedure.  They may prolong bleeding.  Wear loose fitting clothing that may be easy to take off and that you would not mind if it got stained with Betadine or blood.  Do not wear any jewelry or perfume  Remove any nail coloring.  It will interfere with some of our monitoring equipment.  NOTE: Remember  that this is not meant to be interpreted as a complete list of all possible complications.  Unforeseen problems may occur.  BLOOD THINNERS The following drugs contain aspirin or other products, which can cause increased bleeding during surgery and should not be taken for 2 weeks prior to and 1 week after surgery.  If you should need take something for relief of minor pain, you may take acetaminophen which is found in Tylenol,m Datril, Anacin-3 and Panadol. It is not blood thinner. The products listed below are.  Do not take any of the products listed below in addition to any listed on your instruction sheet.  A.P.C or A.P.C with Codeine Codeine Phosphate Capsules #3 Ibuprofen Ridaura  ABC compound Congesprin Imuran rimadil  Advil Cope Indocin Robaxisal  Alka-Seltzer Effervescent Pain Reliever and Antacid Coricidin or Coricidin-D  Indomethacin Rufen  Alka-Seltzer plus Cold Medicine Cosprin Ketoprofen S-A-C Tablets  Anacin Analgesic Tablets or Capsules Coumadin Korlgesic Salflex  Anacin Extra Strength Analgesic tablets or capsules CP-2 Tablets Lanoril Salicylate  Anaprox Cuprimine Capsules Levenox Salocol  Anexsia-D Dalteparin Magan Salsalate  Anodynos Darvon compound Magnesium Salicylate Sine-off  Ansaid Dasin Capsules Magsal Sodium Salicylate  Anturane Depen Capsules Marnal Soma  APF Arthritis pain formula Dewitt's Pills Measurin Stanback  Argesic Dia-Gesic Meclofenamic Sulfinpyrazone  Arthritis Bayer Timed Release Aspirin Diclofenac Meclomen Sulindac  Arthritis pain formula Anacin Dicumarol Medipren Supac  Analgesic (Safety coated) Arthralgen Diffunasal Mefanamic Suprofen  Arthritis Strength Bufferin Dihydrocodeine Mepro Compound Suprol  Arthropan liquid Dopirydamole Methcarbomol with Aspirin Synalgos  ASA tablets/Enseals Disalcid Micrainin Tagament  Ascriptin Doan's Midol Talwin  Ascriptin A/D Dolene Mobidin Tanderil  Ascriptin Extra Strength Dolobid Moblgesic Ticlid  Ascriptin with  Codeine Doloprin or Doloprin with Codeine Momentum Tolectin  Asperbuf Duoprin Mono-gesic Trendar  Aspergum Duradyne Motrin or Motrin IB Triminicin  Aspirin plain, buffered or enteric coated Durasal Myochrisine Trigesic  Aspirin Suppositories Easprin Nalfon Trillsate  Aspirin with Codeine Ecotrin Regular or Extra Strength Naprosyn Uracel  Atromid-S Efficin Naproxen Ursinus  Auranofin Capsules Elmiron Neocylate Vanquish  Axotal Emagrin Norgesic Verin  Azathioprine Empirin or Empirin with Codeine Normiflo Vitamin E  Azolid Emprazil Nuprin Voltaren  Bayer Aspirin plain, buffered or children's or timed BC Tablets or powders  Encaprin Orgaran Warfarin Sodium  Buff-a-Comp Enoxaparin Orudis Zorpin  Buff-a-Comp with Codeine Equegesic Os-Cal-Gesic   Buffaprin Excedrin plain, buffered or Extra Strength Oxalid   Bufferin Arthritis Strength Feldene Oxphenbutazone   Bufferin plain or Extra Strength Feldene Capsules Oxycodone with Aspirin   Bufferin with Codeine Fenoprofen Fenoprofen Pabalate or Pabalate-SF   Buffets II Flogesic Panagesic   Buffinol plain or Extra Strength Florinal or Florinal with Codeine Panwarfarin   Buf-Tabs Flurbiprofen Penicillamine   Butalbital Compound Four-way cold tablets Penicillin   Butazolidin Fragmin Pepto-Bismol   Carbenicillin Geminisyn Percodan   Carna Arthritis Reliever Geopen Persantine   Carprofen Gold's salt Persistin   Chloramphenicol Goody's Phenylbutazone   Chloromycetin Haltrain Piroxlcam   Clmetidine heparin Plaquenil   Cllnoril Hyco-pap Ponstel   Clofibrate Hydroxy chloroquine Propoxyphen         Before stopping any of these medications, be sure to consult the physician who ordered them.  Some, such as Coumadin (Warfarin) are ordered to prevent or treat serious conditions such as "deep thrombosis", "pumonary embolisms", and other heart problems.  The amount of time that you may need off of the medication may also vary with the medication and the reason for  which you were taking it.  If you are taking any of these medications, please make sure you notify your pain physician before you undergo any procedures.         Sacroiliac (SI) Joint Injection Patient Information  Description: The sacroiliac joint connects the scrum (very low back and tailbone) to the ilium (a pelvic bone which also forms half of the hip joint).  Normally this joint experiences very little motion.  When this joint becomes inflamed or unstable low back and or hip and pelvis pain may result.  Injection of this joint with local anesthetics (numbing medicines) and steroids can provide diagnostic information and reduce pain.  This injection is performed with the aid of x-ray guidance into the tailbone area while you are lying on your stomach.   You may experience an electrical sensation down the leg while this is being done.  You may also experience numbness.  We also may ask if we are reproducing your normal pain during the injection.  Conditions which may be treated SI injection:   Low back, buttock, hip or leg pain  Preparation for the Injection:  1. Do not eat any solid food or dairy products within 6 hours of your appointment.  2. You may drink clear liquids up to 2 hours before appointment.  Clear liquids include water, black coffee, juice or soda.  No milk or cream please. 3. You may take your regular medications, including pain medications with a sip of water before your appointment.  Diabetics should hold regular insulin (if take separately) and take 1/2 normal NPH dose the morning of the procedure.  Carry some sugar containing items with you to your appointment. 4. A driver must accompany you and be prepared to drive you home after your procedure. 5. Bring all of your current medications with you. 6. An IV may be inserted and sedation may be given at the discretion of the physician. 7. A blood pressure cuff, EKG and other monitors will often be applied during the  procedure.  Some patients may need to have extra oxygen administered for a short period.  8. You will be asked to provide medical information, including your allergies, prior to the procedure.  We must know immediately if you are taking blood thinners (like Coumadin/Warfarin) or if  you are allergic to IV iodine contrast (dye).  We must know if you could possible be pregnant.  Possible side effects:   Bleeding from needle site  Infection (rare, may require surgery)  Nerve injury (rare)  Numbness & tingling (temporary)  A brief convulsion or seizure  Light-headedness (temporary)  Pain at injection site (several days)  Decreased blood pressure (temporary)  Weakness in the leg (temporary)   Call if you experience:   New onset weakness or numbness of an extremity below the injection site that last more than 8 hours.  Hives or difficulty breathing ( go to the emergency room)  Inflammation or drainage at the injection site  Any new symptoms which are concerning to you  Please note:  Although the local anesthetic injected can often make your back/ hip/ buttock/ leg feel good for several hours after the injections, the pain will likely return.  It takes 3-7 days for steroids to work in the sacroiliac area.  You may not notice any pain relief for at least that one week.  If effective, we will often do a series of three injections spaced 3-6 weeks apart to maximally decrease your pain.  After the initial series, we generally will wait some months before a repeat injection of the same type.  If you have any questions, please call [phone] [facility]

## 2015-06-19 ENCOUNTER — Ambulatory Visit: Payer: PPO | Attending: Pain Medicine | Admitting: Pain Medicine

## 2015-06-19 VITALS — BP 131/88 | HR 70 | Temp 97.4°F | Resp 18 | Ht 68.0 in | Wt 160.0 lb

## 2015-06-19 DIAGNOSIS — M5136 Other intervertebral disc degeneration, lumbar region: Secondary | ICD-10-CM

## 2015-06-19 DIAGNOSIS — M5126 Other intervertebral disc displacement, lumbar region: Secondary | ICD-10-CM | POA: Diagnosis not present

## 2015-06-19 DIAGNOSIS — Z87828 Personal history of other (healed) physical injury and trauma: Secondary | ICD-10-CM | POA: Diagnosis not present

## 2015-06-19 DIAGNOSIS — M545 Low back pain: Secondary | ICD-10-CM | POA: Diagnosis present

## 2015-06-19 DIAGNOSIS — M533 Sacrococcygeal disorders, not elsewhere classified: Secondary | ICD-10-CM

## 2015-06-19 DIAGNOSIS — M5481 Occipital neuralgia: Secondary | ICD-10-CM

## 2015-06-19 DIAGNOSIS — Z981 Arthrodesis status: Secondary | ICD-10-CM

## 2015-06-19 DIAGNOSIS — Z9889 Other specified postprocedural states: Secondary | ICD-10-CM | POA: Diagnosis not present

## 2015-06-19 DIAGNOSIS — M79605 Pain in left leg: Secondary | ICD-10-CM | POA: Diagnosis present

## 2015-06-19 DIAGNOSIS — M5030 Other cervical disc degeneration, unspecified cervical region: Secondary | ICD-10-CM

## 2015-06-19 DIAGNOSIS — M47816 Spondylosis without myelopathy or radiculopathy, lumbar region: Secondary | ICD-10-CM

## 2015-06-19 DIAGNOSIS — G039 Meningitis, unspecified: Secondary | ICD-10-CM

## 2015-06-19 DIAGNOSIS — M47812 Spondylosis without myelopathy or radiculopathy, cervical region: Secondary | ICD-10-CM

## 2015-06-19 DIAGNOSIS — M79604 Pain in right leg: Secondary | ICD-10-CM | POA: Diagnosis present

## 2015-06-19 MED ORDER — TRIAMCINOLONE ACETONIDE 40 MG/ML IJ SUSP
INTRAMUSCULAR | Status: AC
  Administered 2015-06-19: 40 mg
  Filled 2015-06-19: qty 1

## 2015-06-19 MED ORDER — CEFAZOLIN SODIUM 1 G IJ SOLR
INTRAMUSCULAR | Status: AC
  Administered 2015-06-19: 1 g via INTRAVENOUS
  Filled 2015-06-19: qty 10

## 2015-06-19 MED ORDER — TRIAMCINOLONE ACETONIDE 40 MG/ML IJ SUSP
40.0000 mg | Freq: Once | INTRAMUSCULAR | Status: DC
Start: 2015-06-19 — End: 2019-07-18

## 2015-06-19 MED ORDER — ORPHENADRINE CITRATE 30 MG/ML IJ SOLN
60.0000 mg | Freq: Once | INTRAMUSCULAR | Status: DC
Start: 2015-06-19 — End: 2019-07-18

## 2015-06-19 MED ORDER — MIDAZOLAM HCL 5 MG/5ML IJ SOLN
INTRAMUSCULAR | Status: AC
  Filled 2015-06-19: qty 5

## 2015-06-19 MED ORDER — CEFUROXIME AXETIL 250 MG PO TABS: 250.0000 mg | ORAL_TABLET | Freq: Two times a day (BID) | ORAL | Status: DC

## 2015-06-19 MED ORDER — FENTANYL CITRATE (PF) 100 MCG/2ML IJ SOLN
INTRAMUSCULAR | Status: AC
  Administered 2015-06-19: 100 ug via INTRAVENOUS
  Filled 2015-06-19: qty 2

## 2015-06-19 MED ORDER — MIDAZOLAM HCL 2 MG/2ML IJ SOLN: 5.0000 mg | Freq: Once | INTRAMUSCULAR | Status: DC

## 2015-06-19 MED ORDER — BUPIVACAINE HCL (PF) 0.5 % IJ SOLN: 30.0000 mL | Freq: Once | INTRAMUSCULAR | Status: DC

## 2015-06-19 MED ORDER — ORPHENADRINE CITRATE 30 MG/ML IJ SOLN
INTRAMUSCULAR | Status: AC
  Filled 2015-06-19: qty 2

## 2015-06-19 MED ORDER — CEFAZOLIN SODIUM 1-5 GM-% IV SOLN: 1.0000 g | Freq: Once | INTRAVENOUS | Status: DC

## 2015-06-19 MED ORDER — FENTANYL CITRATE (PF) 100 MCG/2ML IJ SOLN: 100.0000 ug | Freq: Once | INTRAMUSCULAR | Status: DC

## 2015-06-19 MED ORDER — LACTATED RINGERS IV SOLN: 1000.0000 mL | INTRAVENOUS | Status: DC

## 2015-06-19 MED ORDER — BUPIVACAINE HCL (PF) 0.5 % IJ SOLN
INTRAMUSCULAR | Status: AC
  Administered 2015-06-19: 30 mL
  Filled 2015-06-19: qty 30
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
## 2015-06-19 NOTE — Patient Instructions (Addendum)
PLAN  Continue present medication oxycodone and MS Contin and begin taking Ceftin antibiotic today as prescribed  F/U PCP Dr. Thedore MinsSingh  or evaliation of  BP and general medical  condition  F/U surgical evaluation as discussed  F/U neurological evaluation. May consider pending follow-up evaluations  May consider radiofrequency rhizolysis or intraspinal procedures pending response to present treatment and F/U evaluation   Patient to call [facility] Management Center should patient have concerns prior to scheduled return appointmentSelective Nerve Root Block Patient Information  Description: Specific nerve roots exit the spinal canal and these nerves can be compressed and inflamed by a bulging disc and bone spurs.  By injecting steroids on the nerve root, we can potentially decrease the inflammation surrounding these nerves, which often leads to decreased pain.  Also, by injecting local anesthesia on the nerve root, this can provide us helpful information to give to your referring doctor if it decreases your pain.  Selective nerve root blocks can be done along the spine from the neck to the low back depending on the location of your pain.   After numbing the skin with local anesthesia, a small needle is passed to the nerve root and the position of the needle is verified using x-ray pictures.  After the needle is in correct position, we then deposit the medication.  You may experience a pressure sensation while this is being done.  The entire block usually lasts less than 15 minutes.  Conditions that may be treated with selective nerve root blocks:  Low back and leg pain  Spinal stenosis  Diagnostic block prior to potential surgery  Neck and arm pain  Post laminectomy syndrome  Preparation for the injection:  1. Do not eat any solid food or dairy products within 6 hours of your appointment. 2. You may drink clear liquids up to 2 hours before an appointment.  Clear liquids include water, black  coffee, juice or soda.  No milk or cream please. 3. You may take your regular medications, including pain medications, with a sip of water before your appointment.  Diabetics should hold regular insulin (if taken separately) and take 1/2 normal NPH dose the morning of the procedure.  Carry some sugar containing items with you to your appointment. 4. A driver must accompany you and be prepared to drive you home after your procedure. 5. Bring all your current medications with you. 6. An IV may be inserted and sedation may be given at the discretion of the physician. 7. A blood pressure cuff, EKG, and other monitors will often be applied during the procedure.  Some patients may need to have extra oxygen administered for a short period. 8. You will be asked to provide medical information, including allergies, prior to the procedure.  We must know immediately if you are taking blood  Thinners (like Coumadin) or if you are allergic to IV iodine contrast (dye).  Possible side-effects: All are usually temporary  Bleeding from needle site  Light headedness  Numbness and tingling  Decreased blood pressure  Weakness in arms/legs  Pressure sensation in back/neck  Pain at injection site (several days)  Possible complications: All are extremely rare  Infection  Nerve injury  Spinal headache (a headache wore with upright position)  Call if you experience:  Fever/chills associated with headache or increased back/neck pain  Headache worsened by an upright position  New onset weakness or numbness of an extremity below the injection site  Hives or difficulty breathing (go to the emergency room)  Inflammation or drainage at the injection site(s)  Severe back/neck pain greater than usual  New symptoms which are concerning to you  Please note:  Although the local anesthetic injected can often make your back or neck feel good for several hours after the injection the pain will likely  return.  It takes 3-5 days for steroids to work on the nerve root. You may not notice any pain relief for at least one week.  If effective, we will often do a series of 3 injections spaced 3-6 weeks apart to maximally decrease your pain.    If you have any questions, please call [phone] [facility] Discharge Inst[facility]  General Discharge Instructions :  If you need to reach your doctor call: Monday-Friday 8:00 am - 4:00 pm at [phone] or toll free [phone].  After clinic hours [phone] to have operator reach doctor.  Bring all of your medication bottles to all your appointments in the pain clinic.  To cancel or reschedule your appointment with Pain Management please remember to call 24 hours in advance to avoid a fee.  Refer to the educational materials which you have been given on: General Risks, I had my Procedure. Discharge Instructions, Post Sedation.  Post Procedure Instructions:  The drugs you were given will stay in your system until tomorrow, so for the next 24 hours you should not drive, make any legal decisions or drink any alcoholic beverages.  You may eat anything you prefer, but it is better to start with liquids then soups and crackers, and gradually work up to solid foods.  Please notify your doctor immediately if you have any unusual bleeding, trouble breathing or pain that is not related to your normal pain.  Depending on the type of procedure that was done, some parts of your body may feel week and/or numb.  This usually clears up by tonight or the next day.  Walk with the use of an assistive device or accompanied by an adult for the 24 hours.  You may use ice on the affected area for the first 24 hours.  Put ice in a Ziploc bag and cover with a towel and place against area 15 minutes on 15 minutes off.  You may switch to heat after 24 hours.

## 2015-06-20 ENCOUNTER — Telehealth: Payer: Self-pay | Admitting: *Deleted

## 2015-06-20 NOTE — Telephone Encounter (Signed)
Patient verbalizes no problems or concerns from procedure.  

## 2015-07-02 ENCOUNTER — Ambulatory Visit: Payer: PPO | Admitting: Pain Medicine

## 2015-07-10 ENCOUNTER — Ambulatory Visit: Payer: PPO | Attending: Pain Medicine | Admitting: Pain Medicine

## 2015-07-10 ENCOUNTER — Encounter: Payer: Self-pay | Admitting: Pain Medicine

## 2015-07-10 VITALS — BP 122/97 | HR 79 | Temp 98.1°F | Resp 16 | Ht 68.0 in | Wt 160.0 lb

## 2015-07-10 DIAGNOSIS — M4806 Spinal stenosis, lumbar region: Secondary | ICD-10-CM | POA: Insufficient documentation

## 2015-07-10 DIAGNOSIS — Z9889 Other specified postprocedural states: Secondary | ICD-10-CM | POA: Diagnosis not present

## 2015-07-10 DIAGNOSIS — M47812 Spondylosis without myelopathy or radiculopathy, cervical region: Secondary | ICD-10-CM

## 2015-07-10 DIAGNOSIS — M533 Sacrococcygeal disorders, not elsewhere classified: Secondary | ICD-10-CM | POA: Diagnosis not present

## 2015-07-10 DIAGNOSIS — M5136 Other intervertebral disc degeneration, lumbar region: Secondary | ICD-10-CM | POA: Diagnosis not present

## 2015-07-10 DIAGNOSIS — M546 Pain in thoracic spine: Secondary | ICD-10-CM | POA: Diagnosis present

## 2015-07-10 DIAGNOSIS — G039 Meningitis, unspecified: Secondary | ICD-10-CM | POA: Diagnosis not present

## 2015-07-10 DIAGNOSIS — M5030 Other cervical disc degeneration, unspecified cervical region: Secondary | ICD-10-CM

## 2015-07-10 DIAGNOSIS — M5481 Occipital neuralgia: Secondary | ICD-10-CM

## 2015-07-10 DIAGNOSIS — Z981 Arthrodesis status: Secondary | ICD-10-CM

## 2015-07-10 DIAGNOSIS — M47816 Spondylosis without myelopathy or radiculopathy, lumbar region: Secondary | ICD-10-CM

## 2015-07-10 DIAGNOSIS — M51369 Other intervertebral disc degeneration, lumbar region without mention of lumbar back pain or lower extremity pain: Secondary | ICD-10-CM

## 2015-07-10 DIAGNOSIS — M545 Low back pain: Secondary | ICD-10-CM | POA: Diagnosis present

## 2015-07-10 MED ORDER — MORPHINE SULFATE ER 60 MG PO TBCR: EXTENDED_RELEASE_TABLET | ORAL | Status: DC

## 2015-07-10 MED ORDER — OXYCODONE HCL 15 MG PO TABS: ORAL_TABLET | ORAL | Status: DC
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
## 2015-07-10 NOTE — Patient Instructions (Addendum)
PLAN  Continue present medication oxycodone and MS Contin  F/U PCP Dr. Thedore MinsSingh  or evaliation of  BP and general medical  condition  F/U surgical evaluation as discussed  Ask receptionists if your insurance has approved you for radiofrequency rhizolysis lumbar facet medial branch nerves  F/U neurological evaluation. May consider pending follow-up evaluations  May consider radiofrequency rhizolysis or intraspinal procedures pending response to present treatment and F/U evaluation   Patient to call [facility] should patient have concerns prior to scheduled return appointment.Radiofrequency Lesioning, Care After Refer to this sheet in the next few weeks. These instructions provide you with information about caring for yourself after your procedure. Your health care provider may also give you more specific instructions. Your treatment has been planned according to current medical practices, but problems sometimes occur. Call your health care provider if you have any problems or questions after your procedure. WHAT TO EXPECT AFTER THE PROCEDURE After your procedure, it is common to have:  Pain from the burned nerve.  Temporary numbness. HOME CARE INSTRUCTIONS  Take over-the-counter and prescription medicines only as told by your health care provider.  Return to your normal activities as told by your health care provider. Ask your health care provider what activities are safe for you.  Pay close attention to how you feel after the procedure. If you start to have pain, write down when it hurts and how it feels. This will help you and your health care provider to know if you need an additional treatment.  Check your needle insertion site every day for signs of infection. Watch for:  Redness, swelling, or pain.  Fluid, blood, or pus.  Keep all follow-up visits as told by your health care provider. This is important. SEEK MEDICAL CARE IF:  Your pain does not get better.  You  have redness, swelling, or pain at the needle insertion site.  You have fluid, blood, or pus coming from the needle insertion site.  You have a fever. SEEK IMMEDIATE MEDICAL CARE IF:  You develop sudden, severe pain.  You develop numbness or tingling near the procedure site that does not go away.   This information is not intended to replace advice given to you by your health care provider. Make sure you discuss any questions you have with your health care provider.   Document Released: 03/18/2011 Document Revised: 04/10/2015 Document Reviewed: 08/27/2014 Elsevier Interactive Patient Education 2016 [employer] Radiofrequency Lesioning Radiofrequency lesioning is a procedure that is performed to relieve pain. The procedure is often used for back, neck, or arm pain. Radiofrequency lesioning involves the use of a machine that creates radio waves to make heat. During the procedure, the heat is applied to the nerve that carries the pain signal. The heat damages the nerve and interferes with the pain signal. Pain relief usually lasts for 6 months to 1 year. [facility] KNOW ABOUT:  Any allergies you have.  All medicines you are taking, including vitamins, herbs, eye drops, creams, and over-the-counter medicines.  Previous problems you or members of your family have had with the use of anesthetics.  Any blood disorders you have.  Previous surgeries you have had.  Any medical conditions you have.  Whether you are pregnant or may be pregnant. RISKS AND COMPLICATIONS Generally, this is a safe procedure. However, problems may occur, including:  Pain or soreness at the injection site.  Infection at the injection site.  Damage to nerves or blood vessels. BEFORE THE  PROCEDURE  Ask your health care provider about:  Changing or stopping your regular medicines. This is especially important if you are taking diabetes medicines or blood thinners.  Taking medicines such  as aspirin and ibuprofen. These medicines can thin your blood. Do not take these medicines before your procedure if your health care provider instructs you not to.  Follow instructions from your health care provider about eating or drinking restrictions.  Plan to have someone take you home after the procedure.  If you go home right after the procedure, plan to have someone with you for 24 hours. PROCEDURE  You will be given one or more of the following:  A medicine to help you relax (sedative).  A medicine to numb the area (local anesthetic).  You will be awake during the procedure. You will need to be able to talk with the health care provider during the procedure.  With the help of a type of X-ray (fluoroscopy), the health care provider will insert a radiofrequency needle into the area to be treated.  Next, a wire that carries the radio waves (electrode) will be put through the radiofrequency needle. An electrical pulse will be sent through the electrode to verify the correct nerve. You will feel a tingling sensation, and you may have muscle twitching.  Then, the tissue that is around the needle tip will be heated by an electric current that is passed using the radiofrequency machine. This will numb the nerves.  A bandage (dressing) will be put on the insertion area after the procedure is done. The procedure may vary among health care providers and hospitals. AFTER THE PROCEDURE  Your blood pressure, heart rate, breathing rate, and blood oxygen level will be monitored often until the medicines you were given have worn off.  Return to your normal activities as directed by your health care provider.   This information is not intended to replace advice given to you by your health care provider. Make sure you discuss any questions you have with your health care provider.   Document Released: 03/18/2011 Document Revised: 04/10/2015 Document Reviewed: 08/27/2014 Elsevier Interactive  Patient Education 2016 [employer] GENERAL RISKS AND COMPLICATIONS  What are the risk, side effects and possible complications? Generally speaking, most procedures are safe.  However, with any procedure there are risks, side effects, and the possibility of complications.  The risks and complications are dependent upon the sites that are lesioned, or the type of nerve block to be performed.  The closer the procedure is to the spine, the more serious the risks are.  Great care is taken when placing the radio frequency needles, block needles or lesioning probes, but sometimes complications can occur.  Infection: Any time there is an injection through the skin, there is a risk of infection.  This is why sterile conditions are used for these blocks.  There are four possible types of infection.  Localized skin infection.  Central Nervous System Infection-This can be in the form of Meningitis, which can be deadly.  Epidural Infections-This can be in the form of an epidural abscess, which can cause pressure inside of the spine, causing compression of the spinal cord with subsequent paralysis. This would require an emergency surgery to decompress, and there are no guarantees that the patient would recover from the paralysis.  Discitis-This is an infection of the intervertebral discs.  It occurs in about 1% of discography procedures.  It is difficult to treat and it may lead to surgery.  2. Pain: the needles have to go through skin and soft tissues, will cause soreness.       3. Damage to internal structures:  The nerves to be lesioned may be near blood vessels or    other nerves which can be potentially damaged.       4. Bleeding: Bleeding is more common if the patient is taking blood thinners such as  aspirin, Coumadin, Ticiid, Plavix, etc., or if he/she have some genetic predisposition  such as hemophilia. Bleeding into the spinal canal can cause compression of the spinal  cord with subsequent  paralysis.  This would require an emergency surgery to  decompress and there are no guarantees that the patient would recover from the  paralysis.       5. Pneumothorax:  Puncturing of a lung is a possibility, every time a needle is introduced in  the area of the chest or upper back.  Pneumothorax refers to free air around the  collapsed lung(s), inside of the thoracic cavity (chest cavity).  Another two possible  complications related to a similar event would include: Hemothorax and Chylothorax.   These are variations of the Pneumothorax, where instead of air around the collapsed  lung(s), you may have blood or chyle, respectively.       6. Spinal headaches: They may occur with any procedures in the area of the spine.       7. Persistent CSF (Cerebro-Spinal Fluid) leakage: This is a rare problem, but may occur  with prolonged intrathecal or epidural catheters either due to the formation of a fistulous  track or a dural tear.       8. Nerve damage: By working so close to the spinal cord, there is always a possibility of  nerve damage, which could be as serious as a permanent spinal cord injury with  paralysis.       9. Death:  Although rare, severe deadly allergic reactions known as "Anaphylactic  reaction" can occur to any of the medications used.      10. Worsening of the symptoms:  We can always make thing worse.  What are the chances of something like this happening? Chances of any of this occuring are extremely low.  By statistics, you have more of a chance of getting killed in a motor vehicle accident: while driving to the hospital than any of the above occurring .  Nevertheless, you should be aware that they are possibilities.  In general, it is similar to taking a shower.  Everybody knows that you can slip, hit your head and get killed.  Does that mean that you should not shower again?  Nevertheless always keep in mind that statistics do not mean anything if you happen to be on the wrong side of  them.  Even if a procedure has a 1 (one) in a 1,000,000 (million) chance of going wrong, it you happen to be that one..Also, keep in mind that by statistics, you have more of a chance of having something go wrong when taking medications.  Who should not have this procedure? If you are on a blood thinning medication (e.g. Coumadin, Plavix, see list of "Blood Thinners"), or if you have an active infection going on, you should not have the procedure.  If you are taking any blood thinners, please inform your physician.  How should I prepare for this procedure?  Do not eat or drink anything at least six hours prior to the procedure.  Bring a driver  with you .  It cannot be a taxi.  Come accompanied by an adult that can drive you back, and that is strong enough to help you if your legs get weak or numb from the local anesthetic.  Take all of your medicines the morning of the procedure with just enough water to swallow them.  If you have diabetes, make sure that you are scheduled to have your procedure done first thing in the morning, whenever possible.  If you have diabetes, take only half of your insulin dose and notify our nurse that you have done so as soon as you arrive at the clinic.  If you are diabetic, but only take blood sugar pills (oral hypoglycemic), then do not take them on the morning of your procedure.  You may take them after you have had the procedure.  Do not take aspirin or any aspirin-containing medications, at least eleven (11) days prior to the procedure.  They may prolong bleeding.  Wear loose fitting clothing that may be easy to take off and that you would not mind if it got stained with Betadine or blood.  Do not wear any jewelry or perfume  Remove any nail coloring.  It will interfere with some of our monitoring equipment.  NOTE: Remember that this is not meant to be interpreted as a complete list of all possible complications.  Unforeseen problems may occur.  BLOOD  THINNERS The following drugs contain aspirin or other products, which can cause increased bleeding during surgery and should not be taken for 2 weeks prior to and 1 week after surgery.  If you should need take something for relief of minor pain, you may take acetaminophen which is found in Tylenol,m Datril, Anacin-3 and Panadol. It is not blood thinner. The products listed below are.  Do not take any of the products listed below in addition to any listed on your instruction sheet.  A.P.C or A.P.C with Codeine Codeine Phosphate Capsules #3 Ibuprofen Ridaura  ABC compound Congesprin Imuran rimadil  Advil Cope Indocin Robaxisal  Alka-Seltzer Effervescent Pain Reliever and Antacid Coricidin or Coricidin-D  Indomethacin Rufen  Alka-Seltzer plus Cold Medicine Cosprin Ketoprofen S-A-C Tablets  Anacin Analgesic Tablets or Capsules Coumadin Korlgesic Salflex  Anacin Extra Strength Analgesic tablets or capsules CP-2 Tablets Lanoril Salicylate  Anaprox Cuprimine Capsules Levenox Salocol  Anexsia-D Dalteparin Magan Salsalate  Anodynos Darvon compound Magnesium Salicylate Sine-off  Ansaid Dasin Capsules Magsal Sodium Salicylate  Anturane Depen Capsules Marnal Soma  APF Arthritis pain formula Dewitt's Pills Measurin Stanback  Argesic Dia-Gesic Meclofenamic Sulfinpyrazone  Arthritis Bayer Timed Release Aspirin Diclofenac Meclomen Sulindac  Arthritis pain formula Anacin Dicumarol Medipren Supac  Analgesic (Safety coated) Arthralgen Diffunasal Mefanamic Suprofen  Arthritis Strength Bufferin Dihydrocodeine Mepro Compound Suprol  Arthropan liquid Dopirydamole Methcarbomol with Aspirin Synalgos  ASA tablets/Enseals Disalcid Micrainin Tagament  Ascriptin Doan's Midol Talwin  Ascriptin A/D Dolene Mobidin Tanderil  Ascriptin Extra Strength Dolobid Moblgesic Ticlid  Ascriptin with Codeine Doloprin or Doloprin with Codeine Momentum Tolectin  Asperbuf Duoprin Mono-gesic Trendar  Aspergum Duradyne Motrin or Motrin  IB Triminicin  Aspirin plain, buffered or enteric coated Durasal Myochrisine Trigesic  Aspirin Suppositories Easprin Nalfon Trillsate  Aspirin with Codeine Ecotrin Regular or Extra Strength Naprosyn Uracel  Atromid-S Efficin Naproxen Ursinus  Auranofin Capsules Elmiron Neocylate Vanquish  Axotal Emagrin Norgesic Verin  Azathioprine Empirin or Empirin with Codeine Normiflo Vitamin E  Azolid Emprazil Nuprin Voltaren  Bayer Aspirin plain, buffered or children's or timed BC Tablets or powders  Encaprin Orgaran Warfarin Sodium  Buff-a-Comp Enoxaparin Orudis Zorpin  Buff-a-Comp with Codeine Equegesic Os-Cal-Gesic   Buffaprin Excedrin plain, buffered or Extra Strength Oxalid   Bufferin Arthritis Strength Feldene Oxphenbutazone   Bufferin plain or Extra Strength Feldene Capsules Oxycodone with Aspirin   Bufferin with Codeine Fenoprofen Fenoprofen Pabalate or Pabalate-SF   Buffets II Flogesic Panagesic   Buffinol plain or Extra Strength Florinal or Florinal with Codeine Panwarfarin   Buf-Tabs Flurbiprofen Penicillamine   Butalbital Compound Four-way cold tablets Penicillin   Butazolidin Fragmin Pepto-Bismol   Carbenicillin Geminisyn Percodan   Carna Arthritis Reliever Geopen Persantine   Carprofen Gold's salt Persistin   Chloramphenicol Goody's Phenylbutazone   Chloromycetin Haltrain Piroxlcam   Clmetidine heparin Plaquenil   Cllnoril Hyco-pap Ponstel   Clofibrate Hydroxy chloroquine Propoxyphen         Before stopping any of these medications, be sure to consult the physician who ordered them.  Some, such as Coumadin (Warfarin) are ordered to prevent or treat serious conditions such as "deep thrombosis", "pumonary embolisms", and other heart problems.  The amount of time that you may need off of the medication may also vary with the medication and the reason for which you were taking it.  If you are taking any of these medications, please make sure you notify your pain physician before you  undergo any procedures.

## 2015-07-10 NOTE — Progress Notes (Signed)
Safety precautions to be maintained throughout the outpatient stay will include: orient to surroundings, keep bed in low position, maintain call bell within reach at all times, provide assistance with transfer out of bed and ambulation.  

## 2015-07-19 ENCOUNTER — Other Ambulatory Visit: Payer: Self-pay | Admitting: Pain Medicine

## 2015-07-30 ENCOUNTER — Encounter: Payer: Self-pay | Admitting: Pain Medicine

## 2015-07-30 ENCOUNTER — Ambulatory Visit: Payer: PPO | Attending: Pain Medicine | Admitting: Pain Medicine

## 2015-07-30 VITALS — BP 132/96 | HR 78 | Temp 97.8°F | Resp 16 | Ht 68.0 in | Wt 160.0 lb

## 2015-07-30 DIAGNOSIS — M47812 Spondylosis without myelopathy or radiculopathy, cervical region: Secondary | ICD-10-CM

## 2015-07-30 DIAGNOSIS — M5030 Other cervical disc degeneration, unspecified cervical region: Secondary | ICD-10-CM

## 2015-07-30 DIAGNOSIS — Z9889 Other specified postprocedural states: Secondary | ICD-10-CM

## 2015-07-30 DIAGNOSIS — M51369 Other intervertebral disc degeneration, lumbar region without mention of lumbar back pain or lower extremity pain: Secondary | ICD-10-CM

## 2015-07-30 DIAGNOSIS — G039 Meningitis, unspecified: Secondary | ICD-10-CM

## 2015-07-30 DIAGNOSIS — M5481 Occipital neuralgia: Secondary | ICD-10-CM

## 2015-07-30 DIAGNOSIS — M5136 Other intervertebral disc degeneration, lumbar region: Secondary | ICD-10-CM | POA: Insufficient documentation

## 2015-07-30 DIAGNOSIS — M545 Low back pain: Secondary | ICD-10-CM | POA: Diagnosis present

## 2015-07-30 DIAGNOSIS — M47816 Spondylosis without myelopathy or radiculopathy, lumbar region: Secondary | ICD-10-CM

## 2015-07-30 DIAGNOSIS — M4806 Spinal stenosis, lumbar region: Secondary | ICD-10-CM | POA: Insufficient documentation

## 2015-07-30 DIAGNOSIS — Z981 Arthrodesis status: Secondary | ICD-10-CM

## 2015-07-30 DIAGNOSIS — M533 Sacrococcygeal disorders, not elsewhere classified: Secondary | ICD-10-CM

## 2015-07-30 MED ORDER — OXYCODONE HCL 15 MG PO TABS: ORAL_TABLET | ORAL | Status: DC

## 2015-07-30 MED ORDER — MORPHINE SULFATE ER 60 MG PO TBCR: EXTENDED_RELEASE_TABLET | ORAL | Status: DC

## 2015-07-30 NOTE — Patient Instructions (Addendum)
PLAN  Continue present medication oxycodone and MS Contin  F/U PCP Dr. Thedore MinsSingh  or evaliation of  BP and general medical  condition  F/U surgical evaluation as discussed  Ask receptionists if your insurance has approved you for radiofrequency rhizolysis lumbar facet medial branch nerves  F/U neurological evaluation. May consider pending follow-up evaluations  May consider radiofrequency rhizolysis or intraspinal procedures pending response to present treatment and F/U evaluation   Patient to call Pain Management Center should patient have concerns prior to scheduled return appointmentPain Management Discharge Instructions  General Discharge Instructions :  If you need to reach your doctor call: Monday-Friday 8:00 am - 4:00 pm at [phone] or toll free [phone].  After clinic hours [phone] to have operator reach doctor.  Bring all of your medication bottles to all your appointments in the pain clinic.  To cancel or reschedule your appointment with Pain Management please remember to call 24 hours in advance to avoid a fee.  Refer to the educational materials which you have been given on: General Risks, I had my Procedure. Discharge Instructions, Post Sedation.  Post Procedure Instructions:  The drugs you were given will stay in your system until tomorrow, so for the next 24 hours you should not drive, make any legal decisions or drink any alcoholic beverages.  You may eat anything you prefer, but it is better to start with liquids then soups and crackers, and gradually work up to solid foods.  Please notify your doctor immediately if you have any unusual bleeding, trouble breathing or pain that is not related to your normal pain.  Depending on the type of procedure that was done, some parts of your body may feel week and/or numb.  This usually clears up by tonight or the next day.  Walk with the use of an assistive device or accompanied by an adult for the 24  hours.  You may use ice on the affected area for the first 24 hours.  Put ice in a Ziploc bag and cover with a towel and place against area 15 minutes on 15 minutes off.  You may switch to heat after 24 hours.GENERAL RISKS AND COMPLICATIONS  What are the risk, side effects and possible complications? Generally speaking, most procedures are safe.  However, with any procedure there are risks, side effects, and the possibility of complications.  The risks and complications are dependent upon the sites that are lesioned, or the type of nerve block to be performed.  The closer the procedure is to the spine, the more serious the risks are.  Great care is taken when placing the radio frequency needles, block needles or lesioning probes, but sometimes complications can occur. 1. Infection: Any time there is an injection through the skin, there is a risk of infection.  This is why sterile conditions are used for these blocks.  There are four possible types of infection. 1. Localized skin infection. 2. Central Nervous System Infection-This can be in the form of Meningitis, which can be deadly. 3. Epidural Infections-This can be in the form of an epidural abscess, which can cause pressure inside of the spine, causing compression of the spinal cord with subsequent paralysis. This would require an emergency surgery to decompress, and there are no guarantees that the patient would recover from the paralysis. 4. Discitis-This is an infection of the intervertebral discs.  It occurs in about 1% of discography procedures.  It is difficult to treat and it may lead to surgery.  2. Pain: the needles have to go through skin and soft tissues, will cause soreness.       3. Damage to internal structures:  The nerves to be lesioned may be near blood vessels or    other nerves which can be potentially damaged.       4. Bleeding: Bleeding is more common if the patient is taking blood thinners such as  aspirin, Coumadin,  Ticiid, Plavix, etc., or if he/she have some genetic predisposition  such as hemophilia. Bleeding into the spinal canal can cause compression of the spinal  cord with subsequent paralysis.  This would require an emergency surgery to  decompress and there are no guarantees that the patient would recover from the  paralysis.       5. Pneumothorax:  Puncturing of a lung is a possibility, every time a needle is introduced in  the area of the chest or upper back.  Pneumothorax refers to free air around the  collapsed lung(s), inside of the thoracic cavity (chest cavity).  Another two possible  complications related to a similar event would include: Hemothorax and Chylothorax.   These are variations of the Pneumothorax, where instead of air around the collapsed  lung(s), you may have blood or chyle, respectively.       6. Spinal headaches: They may occur with any procedures in the area of the spine.       7. Persistent CSF (Cerebro-Spinal Fluid) leakage: This is a rare problem, but may occur  with prolonged intrathecal or epidural catheters either due to the formation of a fistulous  track or a dural tear.       8. Nerve damage: By working so close to the spinal cord, there is always a possibility of  nerve damage, which could be as serious as a permanent spinal cord injury with  paralysis.       9. Death:  Although rare, severe deadly allergic reactions known as "Anaphylactic  reaction" can occur to any of the medications used.      10. Worsening of the symptoms:  We can always make thing worse.  What are the chances of something like this happening? Chances of any of this occuring are extremely low.  By statistics, you have more of a chance of getting killed in a motor vehicle accident: while driving to the hospital than any of the above occurring .  Nevertheless, you should be aware that they are possibilities.  In general, it is similar to taking a shower.  Everybody knows that you can slip, hit your head and  get killed.  Does that mean that you should not shower again?  Nevertheless always keep in mind that statistics do not mean anything if you happen to be on the wrong side of them.  Even if a procedure has a 1 (one) in a 1,000,000 (million) chance of going wrong, it you happen to be that one..Also, keep in mind that by statistics, you have more of a chance of having something go wrong when taking medications.  Who should not have this procedure? If you are on a blood thinning medication (e.g. Coumadin, Plavix, see list of "Blood Thinners"), or if you have an active infection going on, you should not have the procedure.  If you are taking any blood thinners, please inform your physician.  How should I prepare for this procedure?  Do not eat or drink anything at least six hours prior to the procedure.  Bring a driver  with you .  It cannot be a taxi.  Come accompanied by an adult that can drive you back, and that is strong enough to help you if your legs get weak or numb from the local anesthetic.  Take all of your medicines the morning of the procedure with just enough water to swallow them.  If you have diabetes, make sure that you are scheduled to have your procedure done first thing in the morning, whenever possible.  If you have diabetes, take only half of your insulin dose and notify our nurse that you have done so as soon as you arrive at the clinic.  If you are diabetic, but only take blood sugar pills (oral hypoglycemic), then do not take them on the morning of your procedure.  You may take them after you have had the procedure.  Do not take aspirin or any aspirin-containing medications, at least eleven (11) days prior to the procedure.  They may prolong bleeding.  Wear loose fitting clothing that may be easy to take off and that you would not mind if it got stained with Betadine or blood.  Do not wear any jewelry or perfume  Remove any nail coloring.  It will interfere with some of  our monitoring equipment.  NOTE: Remember that this is not meant to be interpreted as a complete list of all possible complications.  Unforeseen problems may occur.  BLOOD THINNERS The following drugs contain aspirin or other products, which can cause increased bleeding during surgery and should not be taken for 2 weeks prior to and 1 week after surgery.  If you should need take something for relief of minor pain, you may take acetaminophen which is found in Tylenol,m Datril, Anacin-3 and Panadol. It is not blood thinner. The products listed below are.  Do not take any of the products listed below in addition to any listed on your instruction sheet.  A.P.C or A.P.C with Codeine Codeine Phosphate Capsules #3 Ibuprofen Ridaura  ABC compound Congesprin Imuran rimadil  Advil Cope Indocin Robaxisal  Alka-Seltzer Effervescent Pain Reliever and Antacid Coricidin or Coricidin-D  Indomethacin Rufen  Alka-Seltzer plus Cold Medicine Cosprin Ketoprofen S-A-C Tablets  Anacin Analgesic Tablets or Capsules Coumadin Korlgesic Salflex  Anacin Extra Strength Analgesic tablets or capsules CP-2 Tablets Lanoril Salicylate  Anaprox Cuprimine Capsules Levenox Salocol  Anexsia-D Dalteparin Magan Salsalate  Anodynos Darvon compound Magnesium Salicylate Sine-off  Ansaid Dasin Capsules Magsal Sodium Salicylate  Anturane Depen Capsules Marnal Soma  APF Arthritis pain formula Dewitt's Pills Measurin Stanback  Argesic Dia-Gesic Meclofenamic Sulfinpyrazone  Arthritis Bayer Timed Release Aspirin Diclofenac Meclomen Sulindac  Arthritis pain formula Anacin Dicumarol Medipren Supac  Analgesic (Safety coated) Arthralgen Diffunasal Mefanamic Suprofen  Arthritis Strength Bufferin Dihydrocodeine Mepro Compound Suprol  Arthropan liquid Dopirydamole Methcarbomol with Aspirin Synalgos  ASA tablets/Enseals Disalcid Micrainin Tagament  Ascriptin Doan's Midol Talwin  Ascriptin A/D Dolene Mobidin Tanderil  Ascriptin Extra Strength  Dolobid Moblgesic Ticlid  Ascriptin with Codeine Doloprin or Doloprin with Codeine Momentum Tolectin  Asperbuf Duoprin Mono-gesic Trendar  Aspergum Duradyne Motrin or Motrin IB Triminicin  Aspirin plain, buffered or enteric coated Durasal Myochrisine Trigesic  Aspirin Suppositories Easprin Nalfon Trillsate  Aspirin with Codeine Ecotrin Regular or Extra Strength Naprosyn Uracel  Atromid-S Efficin Naproxen Ursinus  Auranofin Capsules Elmiron Neocylate Vanquish  Axotal Emagrin Norgesic Verin  Azathioprine Empirin or Empirin with Codeine Normiflo Vitamin E  Azolid Emprazil Nuprin Voltaren  Bayer Aspirin plain, buffered or children's or timed BC Tablets or powders  Encaprin Orgaran Warfarin Sodium  Buff-a-Comp Enoxaparin Orudis Zorpin  Buff-a-Comp with Codeine Equegesic Os-Cal-Gesic   Buffaprin Excedrin plain, buffered or Extra Strength Oxalid   Bufferin Arthritis Strength Feldene Oxphenbutazone   Bufferin plain or Extra Strength Feldene Capsules Oxycodone with Aspirin   Bufferin with Codeine Fenoprofen Fenoprofen Pabalate or Pabalate-SF   Buffets II Flogesic Panagesic   Buffinol plain or Extra Strength Florinal or Florinal with Codeine Panwarfarin   Buf-Tabs Flurbiprofen Penicillamine   Butalbital Compound Four-way cold tablets Penicillin   Butazolidin Fragmin Pepto-Bismol   Carbenicillin Geminisyn Percodan   Carna Arthritis Reliever Geopen Persantine   Carprofen Gold's salt Persistin   Chloramphenicol Goody's Phenylbutazone   Chloromycetin Haltrain Piroxlcam   Clmetidine heparin Plaquenil   Cllnoril Hyco-pap Ponstel   Clofibrate Hydroxy chloroquine Propoxyphen         Before stopping any of these medications, be sure to consult the physician who ordered them.  Some, such as Coumadin (Warfarin) are ordered to prevent or treat serious conditions such as "deep thrombosis", "pumonary embolisms", and other heart problems.  The amount of time that you may need off of the medication may also  vary with the medication and the reason for which you were taking it.  If you are taking any of these medications, please make sure you notify your pain physician before you undergo any procedures.         Radiofrequency Lesioning Radiofrequency lesioning is a procedure that is performed to relieve pain. The procedure is often used for back, neck, or arm pain. Radiofrequency lesioning involves the use of a machine that creates radio waves to make heat. During the procedure, the heat is applied to the nerve that carries the pain signal. The heat damages the nerve and interferes with the pain signal. Pain relief usually lasts for 6 months to 1 year. LET [facility] CARE PROVIDER KNOW ABOUT: 2. Any allergies you have. 3. All medicines you are taking, including vitamins, herbs, eye drops, creams, and over-the-counter medicines. 4. Previous problems you or members of your family have had with the use of anesthetics. 5. Any blood disorders you have. 6. Previous surgeries you have had. 7. Any medical conditions you have. 8. Whether you are pregnant or may be pregnant. RISKS AND COMPLICATIONS Generally, this is a safe procedure. However, problems may occur, including:  Pain or soreness at the injection site.  Infection at the injection site.  Damage to nerves or blood vessels. BEFORE THE PROCEDURE  Ask your health care provider about:  Changing or stopping your regular medicines. This is especially important if you are taking diabetes medicines or blood thinners.  Taking medicines such as aspirin and ibuprofen. These medicines can thin your blood. Do not take these medicines before your procedure if your health care provider instructs you not to.  Follow instructions from your health care provider about eating or drinking restrictions.  Plan to have someone take you home after the procedure.  If you go home right after the procedure, plan to have someone with you for 24  hours. PROCEDURE  You will be given one or more of the following:  A medicine to help you relax (sedative).  A medicine to numb the area (local anesthetic).  You will be awake during the procedure. You will need to be able to talk with the health care provider during the procedure.  With the help of a type of X-ray (fluoroscopy), the health care provider will insert a radiofrequency needle into the area  to be treated.  Next, a wire that carries the radio waves (electrode) will be put through the radiofrequency needle. An electrical pulse will be sent through the electrode to verify the correct nerve. You will feel a tingling sensation, and you may have muscle twitching.  Then, the tissue that is around the needle tip will be heated by an electric current that is passed using the radiofrequency machine. This will numb the nerves.  A bandage (dressing) will be put on the insertion area after the procedure is done. The procedure may vary among health care providers and hospitals. AFTER THE PROCEDURE 12. Your blood pressure, heart rate, breathing rate, and blood oxygen level will be monitored often until the medicines you were given have worn off. 13. Return to your normal activities as directed by your health care provider.   This information is not intended to replace advice given to you by your health care provider. Make sure you discuss any questions you have with your health care provider.   Document Released: 03/18/2011 Document Revised: 04/10/2015 Document Reviewed: 08/27/2014 Elsevier Interactive Patient Education [employer]

## 2015-07-30 NOTE — Progress Notes (Signed)
Safety precautions to be maintained throughout the outpatient stay will include: orient to surroundings, keep bed in low position, maintain call bell within reach at all times, provide assistance with transfer out of bed and ambulation.  
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
## 2015-08-06 ENCOUNTER — Ambulatory Visit: Payer: PPO | Admitting: Pain Medicine

## 2015-09-10 ENCOUNTER — Encounter: Payer: Self-pay | Admitting: Pain Medicine

## 2015-09-10 ENCOUNTER — Ambulatory Visit: Payer: PPO | Attending: Pain Medicine | Admitting: Pain Medicine

## 2015-09-10 VITALS — BP 155/87 | HR 69 | Temp 98.2°F | Resp 16 | Ht 68.0 in | Wt 160.0 lb

## 2015-09-10 DIAGNOSIS — M4806 Spinal stenosis, lumbar region: Secondary | ICD-10-CM | POA: Diagnosis not present

## 2015-09-10 DIAGNOSIS — Z9889 Other specified postprocedural states: Secondary | ICD-10-CM

## 2015-09-10 DIAGNOSIS — M47812 Spondylosis without myelopathy or radiculopathy, cervical region: Secondary | ICD-10-CM

## 2015-09-10 DIAGNOSIS — M533 Sacrococcygeal disorders, not elsewhere classified: Secondary | ICD-10-CM | POA: Diagnosis not present

## 2015-09-10 DIAGNOSIS — M5481 Occipital neuralgia: Secondary | ICD-10-CM

## 2015-09-10 DIAGNOSIS — M47817 Spondylosis without myelopathy or radiculopathy, lumbosacral region: Secondary | ICD-10-CM | POA: Diagnosis not present

## 2015-09-10 DIAGNOSIS — M5136 Other intervertebral disc degeneration, lumbar region: Secondary | ICD-10-CM

## 2015-09-10 DIAGNOSIS — Z981 Arthrodesis status: Secondary | ICD-10-CM

## 2015-09-10 DIAGNOSIS — M791 Myalgia: Secondary | ICD-10-CM | POA: Diagnosis not present

## 2015-09-10 DIAGNOSIS — G039 Meningitis, unspecified: Secondary | ICD-10-CM

## 2015-09-10 DIAGNOSIS — M5030 Other cervical disc degeneration, unspecified cervical region: Secondary | ICD-10-CM | POA: Diagnosis not present

## 2015-09-10 DIAGNOSIS — M5416 Radiculopathy, lumbar region: Secondary | ICD-10-CM | POA: Diagnosis not present

## 2015-09-10 DIAGNOSIS — M51369 Other intervertebral disc degeneration, lumbar region without mention of lumbar back pain or lower extremity pain: Secondary | ICD-10-CM

## 2015-09-10 DIAGNOSIS — M79606 Pain in leg, unspecified: Secondary | ICD-10-CM | POA: Diagnosis not present

## 2015-09-10 DIAGNOSIS — M545 Low back pain: Secondary | ICD-10-CM | POA: Diagnosis not present

## 2015-09-10 DIAGNOSIS — M47816 Spondylosis without myelopathy or radiculopathy, lumbar region: Secondary | ICD-10-CM

## 2015-09-10 MED ORDER — MORPHINE SULFATE ER 60 MG PO TBCR: EXTENDED_RELEASE_TABLET | ORAL | Status: DC

## 2015-09-10 MED ORDER — OXYCODONE HCL 15 MG PO TABS: ORAL_TABLET | ORAL | Status: DC

## 2015-09-10 NOTE — Patient Instructions (Addendum)
PLAN  Continue present medication oxycodone and MS Contin  Block of nerves to the sacroiliac joint to be performed at time of return appointment  F/U PCP Dr. Thedore Mins  or evaliation of  BP and general medical  condition  F/U surgical evaluation as discussed  Ask receptionists if your insurance has approved you for radiofrequency rhizolysis lumbar facet medial branch nerves as we have previously discussed  F/U neurological evaluation. May consider pending follow-up evaluations  May consider radiofrequency rhizolysis or intraspinal procedures pending response to present treatment and F/U evaluation   Patient to call Pain Management Center should patient have concerns prior to scheduled return appointmentSelective Nerve Root Block Patient Information  Description: Specific nerve roots exit the spinal canal and these nerves can be compressed and inflamed by a bulging disc and bone spurs.  By injecting steroids on the nerve root, we can potentially decrease the inflammation surrounding these nerves, which often leads to decreased pain.  Also, by injecting local anesthesia on the nerve root, this can provide [place] helpful information to give to your referring doctor if it decreases your pain.  Selective nerve root blocks can be done along the spine from the neck to the low back depending on the location of your pain.   After numbing the skin with local anesthesia, a small needle is passed to the nerve root and the position of the needle is verified using x-ray pictures.  After the needle is in correct position, we then deposit the medication.  You may experience a pressure sensation while this is being done.  The entire block usually lasts less than 15 minutes.  Conditions that may be treated with selective nerve root blocks:  Low back and leg pain  Spinal stenosis  Diagnostic block prior to potential surgery  Neck and arm pain  Post laminectomy syndrome  Preparation for the injection:  1. Do  not eat any solid food or dairy products within 6 hours of your appointment. 2. You may drink clear liquids up to 2 hours before an appointment.  Clear liquids include water, black coffee, juice or soda.  No milk or cream please. 3. You may take your regular medications, including pain medications, with a sip of water before your appointment.  Diabetics should hold regular insulin (if taken separately) and take 1/2 normal NPH dose the morning of the procedure.  Carry some sugar containing items with you to your appointment. 4. A driver must accompany you and be prepared to drive you home after your procedure. 5. Bring all your current medications with you. 6. An IV may be inserted and sedation may be given at the discretion of the physician. 7. A blood pressure cuff, EKG, and other monitors will often be applied during the procedure.  Some patients may need to have extra oxygen administered for a short period. 8. You will be asked to provide medical information, including allergies, prior to the procedure.  We must know immediately if you are taking blood  Thinners (like Coumadin) or if you are allergic to IV iodine contrast (dye).  Possible side-effects: All are usually temporary  Bleeding from needle site  Light headedness  Numbness and tingling  Decreased blood pressure  Weakness in arms/legs  Pressure sensation in back/neck  Pain at injection site (several days)  Possible complications: All are extremely rare  Infection  Nerve injury  Spinal headache (a headache wore with upright position)  Call if you experience:  Fever/chills associated with headache or increased back/neck pain  Headache worsened by an upright position  New onset weakness or numbness of an extremity below the injection site  Hives or difficulty breathing (go to the emergency room)  Inflammation or drainage at the injection site(s)  Severe back/neck pain greater than usual  New symptoms which  are concerning to you  Please note:  Although the local anesthetic injected can often make your back or neck feel good for several hours after the injection the pain will likely return.  It takes 3-5 days for steroids to work on the nerve root. You may not notice any pain relief for at least one week.  If effective, we will often do a series of 3 injections spaced 3-6 weeks apart to maximally decrease your pain.    If you have any questions, please call [phone] [facility] Discharge Instructions  General Discharge Instructions :  If you need to reach your doctor call: Monday-Friday 8:00 am - 4:00 pm at [phone] or toll free [phone].  After clinic hours [phone] to have operator reach doctor.  Bring all of your medication bottles to all your appointments in the pain clinic.  To cancel or reschedule your appointment with Pain Management please remember to call 24 hours in advance to avoid a fee.  Refer to the educational materials which you have been given on: General Risks, I had my Procedure. Discharge Instructions, Post Sedation.  Post Procedure Instructions:  The drugs you were given will stay in your system until tomorrow, so for the next 24 hours you should not drive, make any legal decisions or drink any alcoholic beverages.  You may eat anything you prefer, but it is better to start with liquids then soups and crackers, and gradually work up to solid foods.  Please notify your doctor immediately if you have any unusual bleeding, trouble breathing or pain that is not related to your normal pain.  Depending on the type of procedure that was done, some parts of your body may feel week and/or numb.  This usually clears up by tonight or the next day.  Walk with the use of an assistive device or accompanied by an adult for the 24 hours.  You may use ice on the affected area for the first 24 hours.  Put ice in a  Ziploc bag and cover with a towel and place against area 15 minutes on 15 minutes off.  You may switch to heat after 24 hours.GENERAL RISKS AND COMPLICATIONS  What are the risk, side effects and possible complications? Generally speaking, most procedures are safe.  However, with any procedure there are risks, side effects, and the possibility of complications.  The risks and complications are dependent upon the sites that are lesioned, or the type of nerve block to be performed.  The closer the procedure is to the spine, the more serious the risks are.  Great care is taken when placing the radio frequency needles, block needles or lesioning probes, but sometimes complications can occur. 1. Infection: Any time there is an injection through the skin, there is a risk of infection.  This is why sterile conditions are used for these blocks.  There are four possible types of infection. 1. Localized skin infection. 2. Central Nervous System Infection-This can be in the form of Meningitis, which can be deadly. 3. Epidural Infections-This can be in the form of an epidural abscess, which can cause pressure inside of the spine, causing compression of the spinal cord with subsequent  paralysis. This would require an emergency surgery to decompress, and there are no guarantees that the patient would recover from the paralysis. 4. Discitis-This is an infection of the intervertebral discs.  It occurs in about 1% of discography procedures.  It is difficult to treat and it may lead to surgery.        2. Pain: the needles have to go through skin and soft tissues, will cause soreness.       3. Damage to internal structures:  The nerves to be lesioned may be near blood vessels or    other nerves which can be potentially damaged.       4. Bleeding: Bleeding is more common if the patient is taking blood thinners such as  aspirin, Coumadin, Ticiid, Plavix, etc., or if he/she have some genetic predisposition  such as  hemophilia. Bleeding into the spinal canal can cause compression of the spinal  cord with subsequent paralysis.  This would require an emergency surgery to  decompress and there are no guarantees that the patient would recover from the  paralysis.       5. Pneumothorax:  Puncturing of a lung is a possibility, every time a needle is introduced in  the area of the chest or upper back.  Pneumothorax refers to free air around the  collapsed lung(s), inside of the thoracic cavity (chest cavity).  Another two possible  complications related to a similar event would include: Hemothorax and Chylothorax.   These are variations of the Pneumothorax, where instead of air around the collapsed  lung(s), you may have blood or chyle, respectively.       6. Spinal headaches: They may occur with any procedures in the area of the spine.       7. Persistent CSF (Cerebro-Spinal Fluid) leakage: This is a rare problem, but may occur  with prolonged intrathecal or epidural catheters either due to the formation of a fistulous  track or a dural tear.       8. Nerve damage: By working so close to the spinal cord, there is always a possibility of  nerve damage, which could be as serious as a permanent spinal cord injury with  paralysis.       9. Death:  Although rare, severe deadly allergic reactions known as "Anaphylactic  reaction" can occur to any of the medications used.      10. Worsening of the symptoms:  We can always make thing worse.  What are the chances of something like this happening? Chances of any of this occuring are extremely low.  By statistics, you have more of a chance of getting killed in a motor vehicle accident: while driving to the hospital than any of the above occurring .  Nevertheless, you should be aware that they are possibilities.  In general, it is similar to taking a shower.  Everybody knows that you can slip, hit your head and get killed.  Does that mean that you should not shower again?  Nevertheless  always keep in mind that statistics do not mean anything if you happen to be on the wrong side of them.  Even if a procedure has a 1 (one) in a 1,000,000 (million) chance of going wrong, it you happen to be that one..Also, keep in mind that by statistics, you have more of a chance of having something go wrong when taking medications.  Who should not have this procedure? If you are on a blood thinning medication (e.g. Coumadin, Plavix,  see list of "Blood Thinners"), or if you have an active infection going on, you should not have the procedure.  If you are taking any blood thinners, please inform your physician.  How should I prepare for this procedure?  Do not eat or drink anything at least six hours prior to the procedure.  Bring a driver with you .  It cannot be a taxi.  Come accompanied by an adult that can drive you back, and that is strong enough to help you if your legs get weak or numb from the local anesthetic.  Take all of your medicines the morning of the procedure with just enough water to swallow them.  If you have diabetes, make sure that you are scheduled to have your procedure done first thing in the morning, whenever possible.  If you have diabetes, take only half of your insulin dose and notify our nurse that you have done so as soon as you arrive at the clinic.  If you are diabetic, but only take blood sugar pills (oral hypoglycemic), then do not take them on the morning of your procedure.  You may take them after you have had the procedure.  Do not take aspirin or any aspirin-containing medications, at least eleven (11) days prior to the procedure.  They may prolong bleeding.  Wear loose fitting clothing that may be easy to take off and that you would not mind if it got stained with Betadine or blood.  Do not wear any jewelry or perfume  Remove any nail coloring.  It will interfere with some of our monitoring equipment.  NOTE: Remember that this is not meant to be  interpreted as a complete list of all possible complications.  Unforeseen problems may occur.  BLOOD THINNERS The following drugs contain aspirin or other products, which can cause increased bleeding during surgery and should not be taken for 2 weeks prior to and 1 week after surgery.  If you should need take something for relief of minor pain, you may take acetaminophen which is found in Tylenol,m Datril, Anacin-3 and Panadol. It is not blood thinner. The products listed below are.  Do not take any of the products listed below in addition to any listed on your instruction sheet.  A.P.C or A.P.C with Codeine Codeine Phosphate Capsules #3 Ibuprofen Ridaura  ABC compound Congesprin Imuran rimadil  Advil Cope Indocin Robaxisal  Alka-Seltzer Effervescent Pain Reliever and Antacid Coricidin or Coricidin-D  Indomethacin Rufen  Alka-Seltzer plus Cold Medicine Cosprin Ketoprofen S-A-C Tablets  Anacin Analgesic Tablets or Capsules Coumadin Korlgesic Salflex  Anacin Extra Strength Analgesic tablets or capsules CP-2 Tablets Lanoril Salicylate  Anaprox Cuprimine Capsules Levenox Salocol  Anexsia-D Dalteparin Magan Salsalate  Anodynos Darvon compound Magnesium Salicylate Sine-off  Ansaid Dasin Capsules Magsal Sodium Salicylate  Anturane Depen Capsules Marnal Soma  APF Arthritis pain formula Dewitt's Pills Measurin Stanback  Argesic Dia-Gesic Meclofenamic Sulfinpyrazone  Arthritis Bayer Timed Release Aspirin Diclofenac Meclomen Sulindac  Arthritis pain formula Anacin Dicumarol Medipren Supac  Analgesic (Safety coated) Arthralgen Diffunasal Mefanamic Suprofen  Arthritis Strength Bufferin Dihydrocodeine Mepro Compound Suprol  Arthropan liquid Dopirydamole Methcarbomol with Aspirin Synalgos  ASA tablets/Enseals Disalcid Micrainin Tagament  Ascriptin Doan's Midol Talwin  Ascriptin A/D Dolene Mobidin Tanderil  Ascriptin Extra Strength Dolobid Moblgesic Ticlid  Ascriptin with Codeine Doloprin or Doloprin  with Codeine Momentum Tolectin  Asperbuf Duoprin Mono-gesic Trendar  Aspergum Duradyne Motrin or Motrin IB Triminicin  Aspirin plain, buffered or enteric coated Durasal Myochrisine Trigesic  Aspirin  Suppositories Easprin Nalfon Trillsate  Aspirin with Codeine Ecotrin Regular or Extra Strength Naprosyn Uracel  Atromid-S Efficin Naproxen Ursinus  Auranofin Capsules Elmiron Neocylate Vanquish  Axotal Emagrin Norgesic Verin  Azathioprine Empirin or Empirin with Codeine Normiflo Vitamin E  Azolid Emprazil Nuprin Voltaren  Bayer Aspirin plain, buffered or children's or timed BC Tablets or powders Encaprin Orgaran Warfarin Sodium  Buff-a-Comp Enoxaparin Orudis Zorpin  Buff-a-Comp with Codeine Equegesic Os-Cal-Gesic   Buffaprin Excedrin plain, buffered or Extra Strength Oxalid   Bufferin Arthritis Strength Feldene Oxphenbutazone   Bufferin plain or Extra Strength Feldene Capsules Oxycodone with Aspirin   Bufferin with Codeine Fenoprofen Fenoprofen Pabalate or Pabalate-SF   Buffets II Flogesic Panagesic   Buffinol plain or Extra Strength Florinal or Florinal with Codeine Panwarfarin   Buf-Tabs Flurbiprofen Penicillamine   Butalbital Compound Four-way cold tablets Penicillin   Butazolidin Fragmin Pepto-Bismol   Carbenicillin Geminisyn Percodan   Carna Arthritis Reliever Geopen Persantine   Carprofen Gold's salt Persistin   Chloramphenicol Goody's Phenylbutazone   Chloromycetin Haltrain Piroxlcam   Clmetidine heparin Plaquenil   Cllnoril Hyco-pap Ponstel   Clofibrate Hydroxy chloroquine Propoxyphen         Before stopping any of these medications, be sure to consult the physician who ordered them.  Some, such as Coumadin (Warfarin) are ordered to prevent or treat serious conditions such as "deep thrombosis", "pumonary embolisms", and other heart problems.  The amount of time that you may need off of the medication may also vary with the medication and the reason for which you were taking it.   If you are taking any of these medications, please make sure you notify your pain physician before you undergo any procedures.
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
## 2015-09-16 ENCOUNTER — Encounter: Payer: Self-pay | Admitting: Pain Medicine

## 2015-09-16 ENCOUNTER — Ambulatory Visit: Payer: PPO | Attending: Pain Medicine | Admitting: Pain Medicine

## 2015-09-16 VITALS — BP 161/97 | HR 53 | Temp 98.2°F | Resp 16 | Ht 68.0 in | Wt 160.0 lb

## 2015-09-16 DIAGNOSIS — M533 Sacrococcygeal disorders, not elsewhere classified: Secondary | ICD-10-CM

## 2015-09-16 DIAGNOSIS — Z9889 Other specified postprocedural states: Secondary | ICD-10-CM

## 2015-09-16 DIAGNOSIS — M5136 Other intervertebral disc degeneration, lumbar region: Secondary | ICD-10-CM | POA: Diagnosis not present

## 2015-09-16 DIAGNOSIS — M79606 Pain in leg, unspecified: Secondary | ICD-10-CM | POA: Diagnosis not present

## 2015-09-16 DIAGNOSIS — G039 Meningitis, unspecified: Secondary | ICD-10-CM

## 2015-09-16 DIAGNOSIS — Z981 Arthrodesis status: Secondary | ICD-10-CM

## 2015-09-16 DIAGNOSIS — M47816 Spondylosis without myelopathy or radiculopathy, lumbar region: Secondary | ICD-10-CM

## 2015-09-16 DIAGNOSIS — M51369 Other intervertebral disc degeneration, lumbar region without mention of lumbar back pain or lower extremity pain: Secondary | ICD-10-CM

## 2015-09-16 DIAGNOSIS — M47817 Spondylosis without myelopathy or radiculopathy, lumbosacral region: Secondary | ICD-10-CM | POA: Diagnosis not present

## 2015-09-16 DIAGNOSIS — M47812 Spondylosis without myelopathy or radiculopathy, cervical region: Secondary | ICD-10-CM

## 2015-09-16 DIAGNOSIS — M5481 Occipital neuralgia: Secondary | ICD-10-CM

## 2015-09-16 DIAGNOSIS — M5030 Other cervical disc degeneration, unspecified cervical region: Secondary | ICD-10-CM

## 2015-09-16 DIAGNOSIS — M545 Low back pain: Secondary | ICD-10-CM | POA: Diagnosis not present

## 2015-09-16 MED ORDER — MIDAZOLAM HCL 5 MG/5ML IJ SOLN: 5.0000 mg | Freq: Once | INTRAMUSCULAR | Status: DC

## 2015-09-16 MED ORDER — BUPIVACAINE HCL (PF) 0.25 % IJ SOLN
INTRAMUSCULAR | Status: AC
Start: 2015-09-16 — End: 2015-09-16
  Administered 2015-09-16: 14:00:00
  Filled 2015-09-16: qty 30

## 2015-09-16 MED ORDER — MIDAZOLAM HCL 5 MG/5ML IJ SOLN
INTRAMUSCULAR | Status: AC
  Administered 2015-09-16: 5 mg via INTRAVENOUS
  Filled 2015-09-16: qty 5

## 2015-09-16 MED ORDER — BUPIVACAINE HCL (PF) 0.25 % IJ SOLN: 30.0000 mL | Freq: Once | INTRAMUSCULAR | Status: DC

## 2015-09-16 MED ORDER — FENTANYL CITRATE (PF) 100 MCG/2ML IJ SOLN
INTRAMUSCULAR | Status: DC
Start: 2015-09-16 — End: 2015-09-16
  Filled 2015-09-16: qty 2

## 2015-09-16 MED ORDER — CEFAZOLIN SODIUM 1-5 GM-% IV SOLN: 1.0000 g | Freq: Once | INTRAVENOUS | Status: DC

## 2015-09-16 MED ORDER — LACTATED RINGERS IV SOLN: 1000.0000 mL | INTRAVENOUS | Status: DC

## 2015-09-16 MED ORDER — TRIAMCINOLONE ACETONIDE 40 MG/ML IJ SUSP: 40.0000 mg | Freq: Once | INTRAMUSCULAR | Status: DC

## 2015-09-16 MED ORDER — ORPHENADRINE CITRATE 30 MG/ML IJ SOLN: 60.0000 mg | Freq: Once | INTRAMUSCULAR | Status: DC

## 2015-09-16 MED ORDER — FENTANYL CITRATE (PF) 100 MCG/2ML IJ SOLN
100.0000 ug | Freq: Once | INTRAMUSCULAR | Status: AC
  Administered 2015-09-16: 100 ug via INTRAVENOUS

## 2015-09-16 MED ORDER — TRIAMCINOLONE ACETONIDE 40 MG/ML IJ SUSP
INTRAMUSCULAR | Status: AC
  Administered 2015-09-16: 14:00:00
  Filled 2015-09-16: qty 1

## 2015-09-16 MED ORDER — ORPHENADRINE CITRATE 30 MG/ML IJ SOLN
INTRAMUSCULAR | Status: AC
  Administered 2015-09-16: 14:00:00
  Filled 2015-09-16: qty 2

## 2015-09-16 MED ORDER — CEFUROXIME AXETIL 250 MG PO TABS: 250.0000 mg | ORAL_TABLET | Freq: Two times a day (BID) | ORAL | Status: DC
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
## 2015-09-16 NOTE — Progress Notes (Signed)
Safety precautions to be maintained throughout the outpatient stay will include: orient to surroundings, keep bed in low position, maintain call bell within reach at all times, provide assistance with transfer out of bed and ambulation.  

## 2015-09-16 NOTE — Patient Instructions (Addendum)
PLAN  Continue present medication oxycodone and MS Contin and begin taking Ceftin antibiotic today as prescribed  F/U PCP Dr. Thedore Mins  or evaliation of  BP and general medical  condition. Please see Dr. Thedore Mins this week for evaluation of elevated blood pressure  F/U surgical evaluation as discussed  F/U neurological evaluation. May consider pending follow-up evaluations  May consider radiofrequency rhizolysis or intraspinal procedures pending response to present treatment and F/U evaluation   Patient to call [facility] should patient have concerns prior to scheduled return appointmentSelective Nerve Root Block Patient Information  Description: Specific nerve roots exit the spinal canal and these nerves can be compressed and inflamed by a bulging disc and bone spurs.  By injecting steroids on the nerve root, we can potentially decrease the inflammation surrounding these nerves, which often leads to decreased pain.  Also, by injecting local anesthesia on the nerve root, this can provide [place] helpful information to give to your referring doctor if it decreases your pain.  Selective nerve root blocks can be done along the spine from the neck to the low back depending on the location of your pain.   After numbing the skin with local anesthesia, a small needle is passed to the nerve root and the position of the needle is verified using x-ray pictures.  After the needle is in correct position, we then deposit the medication.  You may experience a pressure sensation while this is being done.  The entire block usually lasts less than 15 minutes.  Conditions that may be treated with selective nerve root blocks:  Low back and leg pain  Spinal stenosis  Diagnostic block prior to potential surgery  Neck and arm pain  Post laminectomy syndrome  Preparation for the injection:  1. Do not eat any solid food or dairy products within 6 hours of your appointment. 2. You may drink clear liquids up  to 2 hours before an appointment.  Clear liquids include water, black coffee, juice or soda.  No milk or cream please. 3. You may take your regular medications, including pain medications, with a sip of water before your appointment.  Diabetics should hold regular insulin (if taken separately) and take 1/2 normal NPH dose the morning of the procedure.  Carry some sugar containing items with you to your appointment. 4. A driver must accompany you and be prepared to drive you home after your procedure. 5. Bring all your current medications with you. 6. An IV may be inserted and sedation may be given at the discretion of the physician. 7. A blood pressure cuff, EKG, and other monitors will often be applied during the procedure.  Some patients may need to have extra oxygen administered for a short period. 8. You will be asked to provide medical information, including allergies, prior to the procedure.  We must know immediately if you are taking blood  Thinners (like Coumadin) or if you are allergic to IV iodine contrast (dye).  Possible side-effects: All are usually temporary  Bleeding from needle site  Light headedness  Numbness and tingling  Decreased blood pressure  Weakness in arms/legs  Pressure sensation in back/neck  Pain at injection site (several days)  Possible complications: All are extremely rare  Infection  Nerve injury  Spinal headache (a headache wore with upright position)  Call if you experience:  Fever/chills associated with headache or increased back/neck pain  Headache worsened by an upright position  New onset weakness or numbness of an extremity below the  injection site  Hives or difficulty breathing (go to the emergency room)  Inflammation or drainage at the injection site(s)  Severe back/neck pain greater than usual  New symptoms which are concerning to you  Please note:  Although the local anesthetic injected can often make your back or neck  feel good for several hours after the injection the pain will likely return.  It takes 3-5 days for steroids to work on the nerve root. You may not notice any pain relief for at least one week.  If effective, we will often do a series of 3 injections spaced 3-6 weeks apart to maximally decrease your pain.    If you have any questions, please call [phone] [facility] Discharge Instructions  General Discharge Instructions :  If you need to reach your doctor call: Monday-Friday 8:00 am - 4:00 pm at [phone] or toll free [phone].  After clinic hours [phone] to have operator reach doctor.  Bring all of your medication bottles to all your appointments in the pain clinic.  To cancel or reschedule your appointment with Pain Management please remember to call 24 hours in advance to avoid a fee.  Refer to the educational materials which you have been given on: General Risks, I had my Procedure. Discharge Instructions, Post Sedation.  Post Procedure Instructions:  The drugs you were given will stay in your system until tomorrow, so for the next 24 hours you should not drive, make any legal decisions or drink any alcoholic beverages.  You may eat anything you prefer, but it is better to start with liquids then soups and crackers, and gradually work up to solid foods.  Please notify your doctor immediately if you have any unusual bleeding, trouble breathing or pain that is not related to your normal pain.  Depending on the type of procedure that was done, some parts of your body may feel week and/or numb.  This usually clears up by tonight or the next day.  Walk with the use of an assistive device or accompanied by an adult for the 24 hours.  You may use ice on the affected area for the first 24 hours.  Put ice in a Ziploc bag and cover with a towel and place against area 15 minutes on 15 minutes off.  You may switch to heat after  24 hours.

## 2015-09-17 ENCOUNTER — Telehealth: Payer: Self-pay | Admitting: *Deleted

## 2015-09-17 NOTE — Telephone Encounter (Signed)
No problems post procedure. 

## 2015-10-07 ENCOUNTER — Ambulatory Visit: Payer: PPO | Attending: Pain Medicine | Admitting: Pain Medicine

## 2015-10-07 ENCOUNTER — Encounter: Payer: Self-pay | Admitting: Pain Medicine

## 2015-10-07 VITALS — BP 128/79 | HR 63 | Temp 97.1°F | Resp 16 | Ht 68.0 in | Wt 165.0 lb

## 2015-10-07 DIAGNOSIS — Z9889 Other specified postprocedural states: Secondary | ICD-10-CM | POA: Diagnosis not present

## 2015-10-07 DIAGNOSIS — M5030 Other cervical disc degeneration, unspecified cervical region: Secondary | ICD-10-CM | POA: Insufficient documentation

## 2015-10-07 DIAGNOSIS — M5481 Occipital neuralgia: Secondary | ICD-10-CM

## 2015-10-07 DIAGNOSIS — M791 Myalgia: Secondary | ICD-10-CM | POA: Diagnosis not present

## 2015-10-07 DIAGNOSIS — M79606 Pain in leg, unspecified: Secondary | ICD-10-CM | POA: Diagnosis not present

## 2015-10-07 DIAGNOSIS — M47812 Spondylosis without myelopathy or radiculopathy, cervical region: Secondary | ICD-10-CM

## 2015-10-07 DIAGNOSIS — M533 Sacrococcygeal disorders, not elsewhere classified: Secondary | ICD-10-CM | POA: Diagnosis not present

## 2015-10-07 DIAGNOSIS — M5136 Other intervertebral disc degeneration, lumbar region: Secondary | ICD-10-CM | POA: Diagnosis not present

## 2015-10-07 DIAGNOSIS — M5416 Radiculopathy, lumbar region: Secondary | ICD-10-CM | POA: Diagnosis not present

## 2015-10-07 DIAGNOSIS — M47817 Spondylosis without myelopathy or radiculopathy, lumbosacral region: Secondary | ICD-10-CM | POA: Diagnosis not present

## 2015-10-07 DIAGNOSIS — F1190 Opioid use, unspecified, uncomplicated: Secondary | ICD-10-CM | POA: Diagnosis not present

## 2015-10-07 DIAGNOSIS — M545 Low back pain: Secondary | ICD-10-CM | POA: Diagnosis not present

## 2015-10-07 DIAGNOSIS — Z5181 Encounter for therapeutic drug level monitoring: Secondary | ICD-10-CM | POA: Diagnosis not present

## 2015-10-07 DIAGNOSIS — Z981 Arthrodesis status: Secondary | ICD-10-CM

## 2015-10-07 DIAGNOSIS — M4806 Spinal stenosis, lumbar region: Secondary | ICD-10-CM | POA: Diagnosis not present

## 2015-10-07 DIAGNOSIS — G039 Meningitis, unspecified: Secondary | ICD-10-CM

## 2015-10-07 DIAGNOSIS — G8929 Other chronic pain: Secondary | ICD-10-CM | POA: Diagnosis not present

## 2015-10-07 DIAGNOSIS — M47816 Spondylosis without myelopathy or radiculopathy, lumbar region: Secondary | ICD-10-CM

## 2015-10-07 DIAGNOSIS — Z79899 Other long term (current) drug therapy: Secondary | ICD-10-CM | POA: Diagnosis not present

## 2015-10-07 MED ORDER — MORPHINE SULFATE ER 60 MG PO TBCR: EXTENDED_RELEASE_TABLET | ORAL | Status: DC

## 2015-10-07 MED ORDER — OXYCODONE HCL 15 MG PO TABS: ORAL_TABLET | ORAL | Status: DC

## 2015-10-07 NOTE — Progress Notes (Signed)
Safety precautions to be maintained throughout the outpatient stay will include: orient to surroundings, keep bed in low position, maintain call bell within reach at all times, provide assistance with transfer out of bed and ambulation.  

## 2015-10-07 NOTE — Patient Instructions (Signed)
PLAN  Continue present medication oxycodone and MS Contin  F/U PCP Dr. Thedore MinsSingh  or evaliation of  BP and general medical  condition  F/U surgical evaluation as discussed  Ask receptionists if your insurance has approved you for radiofrequency rhizolysis lumbar facet medial branch nerves as we have previously discussed  F/U neurological evaluation. May consider pending follow-up evaluations  May consider radiofrequency rhizolysis or intraspinal procedures pending response to present treatment and F/U evaluation   Patient to call [facility] should patient have concerns prior to scheduled return appointment
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
## 2015-10-08 ENCOUNTER — Encounter: Payer: PPO | Admitting: Pain Medicine

## 2015-10-25 ENCOUNTER — Other Ambulatory Visit: Payer: Self-pay | Admitting: Pain Medicine

## 2015-11-07 ENCOUNTER — Ambulatory Visit: Payer: PPO | Attending: Pain Medicine | Admitting: Pain Medicine

## 2015-11-07 ENCOUNTER — Encounter: Payer: Self-pay | Admitting: Pain Medicine

## 2015-11-07 VITALS — BP 131/83 | HR 52 | Temp 97.9°F | Resp 16 | Ht 68.0 in | Wt 160.0 lb

## 2015-11-07 DIAGNOSIS — M47812 Spondylosis without myelopathy or radiculopathy, cervical region: Secondary | ICD-10-CM

## 2015-11-07 DIAGNOSIS — M5030 Other cervical disc degeneration, unspecified cervical region: Secondary | ICD-10-CM

## 2015-11-07 DIAGNOSIS — M47816 Spondylosis without myelopathy or radiculopathy, lumbar region: Secondary | ICD-10-CM

## 2015-11-07 DIAGNOSIS — M5134 Other intervertebral disc degeneration, thoracic region: Secondary | ICD-10-CM | POA: Diagnosis not present

## 2015-11-07 DIAGNOSIS — M4806 Spinal stenosis, lumbar region: Secondary | ICD-10-CM | POA: Insufficient documentation

## 2015-11-07 DIAGNOSIS — Z9889 Other specified postprocedural states: Secondary | ICD-10-CM

## 2015-11-07 DIAGNOSIS — M79606 Pain in leg, unspecified: Secondary | ICD-10-CM | POA: Diagnosis not present

## 2015-11-07 DIAGNOSIS — M791 Myalgia: Secondary | ICD-10-CM | POA: Diagnosis not present

## 2015-11-07 DIAGNOSIS — M51369 Other intervertebral disc degeneration, lumbar region without mention of lumbar back pain or lower extremity pain: Secondary | ICD-10-CM

## 2015-11-07 DIAGNOSIS — M533 Sacrococcygeal disorders, not elsewhere classified: Secondary | ICD-10-CM

## 2015-11-07 DIAGNOSIS — M47817 Spondylosis without myelopathy or radiculopathy, lumbosacral region: Secondary | ICD-10-CM | POA: Diagnosis not present

## 2015-11-07 DIAGNOSIS — Z981 Arthrodesis status: Secondary | ICD-10-CM

## 2015-11-07 DIAGNOSIS — M5481 Occipital neuralgia: Secondary | ICD-10-CM

## 2015-11-07 DIAGNOSIS — G039 Meningitis, unspecified: Secondary | ICD-10-CM

## 2015-11-07 DIAGNOSIS — M545 Low back pain: Secondary | ICD-10-CM | POA: Diagnosis not present

## 2015-11-07 DIAGNOSIS — M5136 Other intervertebral disc degeneration, lumbar region: Secondary | ICD-10-CM

## 2015-11-07 DIAGNOSIS — M5416 Radiculopathy, lumbar region: Secondary | ICD-10-CM | POA: Diagnosis not present

## 2015-11-07 MED ORDER — OXYCODONE HCL 15 MG PO TABS: ORAL_TABLET | ORAL | Status: DC

## 2015-11-07 MED ORDER — MORPHINE SULFATE ER 60 MG PO TBCR: EXTENDED_RELEASE_TABLET | ORAL | Status: DC

## 2015-11-07 NOTE — Progress Notes (Signed)
Safety precautions to be maintained throughout the outpatient stay will include: orient to surroundings, keep bed in low position, maintain call bell within reach at all times, provide assistance with transfer out of bed and ambulation.  

## 2015-11-07 NOTE — Patient Instructions (Addendum)
PLAN  Continue present medication oxycodone and MS Contin  Block of nerves to sacroiliac joint to be performed at time of return appointment  F/U PCP Dr. Thedore Mins  or evaliation of  BP and general medical  condition  F/U surgical evaluation as discussed. Patient without desire to consider surgery at this time  Ask receptionists if your insurance has approved you for radiofrequency rhizolysis lumbar facet medial branch nerves as we have previously discussed  F/U neurological evaluation. May consider PNCV/EMG studies and other studies pending follow-up evaluations  We are awaiting insurance approval for radiofrequency lumbar facets  Patient to call [facility] should patient have concerns prior to scheduled return appointmentGENERAL RISKS AND COMPLICATIONS  What are the risk, side effects and possible complications? Generally speaking, most procedures are safe.  However, with any procedure there are risks, side effects, and the possibility of complications.  The risks and complications are dependent upon the sites that are lesioned, or the type of nerve block to be performed.  The closer the procedure is to the spine, the more serious the risks are.  Great care is taken when placing the radio frequency needles, block needles or lesioning probes, but sometimes complications can occur. 1. Infection: Any time there is an injection through the skin, there is a risk of infection.  This is why sterile conditions are used for these blocks.  There are four possible types of infection. 1. Localized skin infection. 2. Central Nervous System Infection-This can be in the form of Meningitis, which can be deadly. 3. Epidural Infections-This can be in the form of an epidural abscess, which can cause pressure inside of the spine, causing compression of the spinal cord with subsequent paralysis. This would require an emergency surgery to decompress, and there are no guarantees that the patient would  recover from the paralysis. 4. Discitis-This is an infection of the intervertebral discs.  It occurs in about 1% of discography procedures.  It is difficult to treat and it may lead to surgery.        2. Pain: the needles have to go through skin and soft tissues, will cause soreness.       3. Damage to internal structures:  The nerves to be lesioned may be near blood vessels or    other nerves which can be potentially damaged.       4. Bleeding: Bleeding is more common if the patient is taking blood thinners such as  aspirin, Coumadin, Ticiid, Plavix, etc., or if he/she have some genetic predisposition  such as hemophilia. Bleeding into the spinal canal can cause compression of the spinal  cord with subsequent paralysis.  This would require an emergency surgery to  decompress and there are no guarantees that the patient would recover from the  paralysis.       5. Pneumothorax:  Puncturing of a lung is a possibility, every time a needle is introduced in  the area of the chest or upper back.  Pneumothorax refers to free air around the  collapsed lung(s), inside of the thoracic cavity (chest cavity).  Another two possible  complications related to a similar event would include: Hemothorax and Chylothorax.   These are variations of the Pneumothorax, where instead of air around the collapsed  lung(s), you may have blood or chyle, respectively.       6. Spinal headaches: They may occur with any procedures in the area of the spine.       7. Persistent CSF (Cerebro-Spinal  Fluid) leakage: This is a rare problem, but may occur  with prolonged intrathecal or epidural catheters either due to the formation of a fistulous  track or a dural tear.       8. Nerve damage: By working so close to the spinal cord, there is always a possibility of  nerve damage, which could be as serious as a permanent spinal cord injury with  paralysis.       9. Death:  Although rare, severe deadly allergic reactions known as "Anaphylactic   reaction" can occur to any of the medications used.      10. Worsening of the symptoms:  We can always make thing worse.  What are the chances of something like this happening? Chances of any of this occuring are extremely low.  By statistics, you have more of a chance of getting killed in a motor vehicle accident: while driving to the hospital than any of the above occurring .  Nevertheless, you should be aware that they are possibilities.  In general, it is similar to taking a shower.  Everybody knows that you can slip, hit your head and get killed.  Does that mean that you should not shower again?  Nevertheless always keep in mind that statistics do not mean anything if you happen to be on the wrong side of them.  Even if a procedure has a 1 (one) in a 1,000,000 (million) chance of going wrong, it you happen to be that one..Also, keep in mind that by statistics, you have more of a chance of having something go wrong when taking medications.  Who should not have this procedure? If you are on a blood thinning medication (e.g. Coumadin, Plavix, see list of "Blood Thinners"), or if you have an active infection going on, you should not have the procedure.  If you are taking any blood thinners, please inform your physician.  How should I prepare for this procedure?  Do not eat or drink anything at least six hours prior to the procedure.  Bring a driver with you .  It cannot be a taxi.  Come accompanied by an adult that can drive you back, and that is strong enough to help you if your legs get weak or numb from the local anesthetic.  Take all of your medicines the morning of the procedure with just enough water to swallow them.  If you have diabetes, make sure that you are scheduled to have your procedure done first thing in the morning, whenever possible.  If you have diabetes, take only half of your insulin dose and notify our nurse that you have done so as soon as you arrive at the clinic.  If  you are diabetic, but only take blood sugar pills (oral hypoglycemic), then do not take them on the morning of your procedure.  You may take them after you have had the procedure.  Do not take aspirin or any aspirin-containing medications, at least eleven (11) days prior to the procedure.  They may prolong bleeding.  Wear loose fitting clothing that may be easy to take off and that you would not mind if it got stained with Betadine or blood.  Do not wear any jewelry or perfume  Remove any nail coloring.  It will interfere with some of our monitoring equipment.  NOTE: Remember that this is not meant to be interpreted as a complete list of all possible complications.  Unforeseen problems may occur.  BLOOD THINNERS The following drugs contain  aspirin or other products, which can cause increased bleeding during surgery and should not be taken for 2 weeks prior to and 1 week after surgery.  If you should need take something for relief of minor pain, you may take acetaminophen which is found in Tylenol,m Datril, Anacin-3 and Panadol. It is not blood thinner. The products listed below are.  Do not take any of the products listed below in addition to any listed on your instruction sheet.  A.P.C or A.P.C with Codeine Codeine Phosphate Capsules #3 Ibuprofen Ridaura  ABC compound Congesprin Imuran rimadil  Advil Cope Indocin Robaxisal  Alka-Seltzer Effervescent Pain Reliever and Antacid Coricidin or Coricidin-D  Indomethacin Rufen  Alka-Seltzer plus Cold Medicine Cosprin Ketoprofen S-A-C Tablets  Anacin Analgesic Tablets or Capsules Coumadin Korlgesic Salflex  Anacin Extra Strength Analgesic tablets or capsules CP-2 Tablets Lanoril Salicylate  Anaprox Cuprimine Capsules Levenox Salocol  Anexsia-D Dalteparin Magan Salsalate  Anodynos Darvon compound Magnesium Salicylate Sine-off  Ansaid Dasin Capsules Magsal Sodium Salicylate  Anturane Depen Capsules Marnal Soma  APF Arthritis pain formula Dewitt's  Pills Measurin Stanback  Argesic Dia-Gesic Meclofenamic Sulfinpyrazone  Arthritis Bayer Timed Release Aspirin Diclofenac Meclomen Sulindac  Arthritis pain formula Anacin Dicumarol Medipren Supac  Analgesic (Safety coated) Arthralgen Diffunasal Mefanamic Suprofen  Arthritis Strength Bufferin Dihydrocodeine Mepro Compound Suprol  Arthropan liquid Dopirydamole Methcarbomol with Aspirin Synalgos  ASA tablets/Enseals Disalcid Micrainin Tagament  Ascriptin Doan's Midol Talwin  Ascriptin A/D Dolene Mobidin Tanderil  Ascriptin Extra Strength Dolobid Moblgesic Ticlid  Ascriptin with Codeine Doloprin or Doloprin with Codeine Momentum Tolectin  Asperbuf Duoprin Mono-gesic Trendar  Aspergum Duradyne Motrin or Motrin IB Triminicin  Aspirin plain, buffered or enteric coated Durasal Myochrisine Trigesic  Aspirin Suppositories Easprin Nalfon Trillsate  Aspirin with Codeine Ecotrin Regular or Extra Strength Naprosyn Uracel  Atromid-S Efficin Naproxen Ursinus  Auranofin Capsules Elmiron Neocylate Vanquish  Axotal Emagrin Norgesic Verin  Azathioprine Empirin or Empirin with Codeine Normiflo Vitamin E  Azolid Emprazil Nuprin Voltaren  Bayer Aspirin plain, buffered or children's or timed BC Tablets or powders Encaprin Orgaran Warfarin Sodium  Buff-a-Comp Enoxaparin Orudis Zorpin  Buff-a-Comp with Codeine Equegesic Os-Cal-Gesic   Buffaprin Excedrin plain, buffered or Extra Strength Oxalid   Bufferin Arthritis Strength Feldene Oxphenbutazone   Bufferin plain or Extra Strength Feldene Capsules Oxycodone with Aspirin   Bufferin with Codeine Fenoprofen Fenoprofen Pabalate or Pabalate-SF   Buffets II Flogesic Panagesic   Buffinol plain or Extra Strength Florinal or Florinal with Codeine Panwarfarin   Buf-Tabs Flurbiprofen Penicillamine   Butalbital Compound Four-way cold tablets Penicillin   Butazolidin Fragmin Pepto-Bismol   Carbenicillin Geminisyn Percodan   Carna Arthritis Reliever Geopen Persantine    Carprofen Gold's salt Persistin   Chloramphenicol Goody's Phenylbutazone   Chloromycetin Haltrain Piroxlcam   Clmetidine heparin Plaquenil   Cllnoril Hyco-pap Ponstel   Clofibrate Hydroxy chloroquine Propoxyphen         Before stopping any of these medications, be sure to consult the physician who ordered them.  Some, such as Coumadin (Warfarin) are ordered to prevent or treat serious conditions such as "deep thrombosis", "pumonary embolisms", and other heart problems.  The amount of time that you may need off of the medication may also vary with the medication and the reason for which you were taking it.  If you are taking any of these medications, please make sure you notify your pain physician before you undergo any procedures.
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
## 2015-11-20 ENCOUNTER — Encounter: Payer: Self-pay | Admitting: Pain Medicine

## 2015-11-20 ENCOUNTER — Ambulatory Visit: Payer: PPO | Attending: Pain Medicine | Admitting: Pain Medicine

## 2015-11-20 VITALS — BP 117/78 | HR 62 | Temp 97.6°F | Resp 18 | Ht 68.0 in | Wt 160.0 lb

## 2015-11-20 DIAGNOSIS — M47817 Spondylosis without myelopathy or radiculopathy, lumbosacral region: Secondary | ICD-10-CM | POA: Diagnosis not present

## 2015-11-20 DIAGNOSIS — M5030 Other cervical disc degeneration, unspecified cervical region: Secondary | ICD-10-CM

## 2015-11-20 DIAGNOSIS — M545 Low back pain: Secondary | ICD-10-CM | POA: Diagnosis not present

## 2015-11-20 DIAGNOSIS — M5136 Other intervertebral disc degeneration, lumbar region: Secondary | ICD-10-CM | POA: Insufficient documentation

## 2015-11-20 DIAGNOSIS — Z981 Arthrodesis status: Secondary | ICD-10-CM

## 2015-11-20 DIAGNOSIS — Z9889 Other specified postprocedural states: Secondary | ICD-10-CM

## 2015-11-20 DIAGNOSIS — G039 Meningitis, unspecified: Secondary | ICD-10-CM

## 2015-11-20 DIAGNOSIS — M79606 Pain in leg, unspecified: Secondary | ICD-10-CM | POA: Diagnosis not present

## 2015-11-20 DIAGNOSIS — M47816 Spondylosis without myelopathy or radiculopathy, lumbar region: Secondary | ICD-10-CM

## 2015-11-20 DIAGNOSIS — M5481 Occipital neuralgia: Secondary | ICD-10-CM

## 2015-11-20 DIAGNOSIS — M533 Sacrococcygeal disorders, not elsewhere classified: Secondary | ICD-10-CM

## 2015-11-20 DIAGNOSIS — M47812 Spondylosis without myelopathy or radiculopathy, cervical region: Secondary | ICD-10-CM

## 2015-11-20 MED ORDER — ORPHENADRINE CITRATE 30 MG/ML IJ SOLN: 60.0000 mg | Freq: Once | INTRAMUSCULAR | Status: DC

## 2015-11-20 MED ORDER — FENTANYL CITRATE (PF) 100 MCG/2ML IJ SOLN
INTRAMUSCULAR | Status: AC
  Administered 2015-11-20: 100 ug via INTRAVENOUS
  Filled 2015-11-20: qty 2

## 2015-11-20 MED ORDER — MIDAZOLAM HCL 5 MG/5ML IJ SOLN: 5.0000 mg | Freq: Once | INTRAMUSCULAR | Status: DC

## 2015-11-20 MED ORDER — LACTATED RINGERS IV SOLN: 1000.0000 mL | INTRAVENOUS | Status: DC

## 2015-11-20 MED ORDER — CEFAZOLIN SODIUM 1-5 GM-% IV SOLN: 1.0000 g | Freq: Once | INTRAVENOUS | Status: DC

## 2015-11-20 MED ORDER — TRIAMCINOLONE ACETONIDE 40 MG/ML IJ SUSP
INTRAMUSCULAR | Status: AC
  Administered 2015-11-20: 10:00:00
  Filled 2015-11-20: qty 1

## 2015-11-20 MED ORDER — MIDAZOLAM HCL 5 MG/5ML IJ SOLN
INTRAMUSCULAR | Status: AC
Start: 2015-11-20 — End: 2015-11-20
  Administered 2015-11-20: 4 mg via INTRAVENOUS
  Filled 2015-11-20: qty 5

## 2015-11-20 MED ORDER — CEFAZOLIN SODIUM 1 G IJ SOLR
INTRAMUSCULAR | Status: AC
  Administered 2015-11-20: 1 g via INTRAVENOUS
  Filled 2015-11-20: qty 10

## 2015-11-20 MED ORDER — CEFUROXIME AXETIL 250 MG PO TABS: 250.0000 mg | ORAL_TABLET | Freq: Two times a day (BID) | ORAL | Status: DC

## 2015-11-20 MED ORDER — BUPIVACAINE HCL (PF) 0.25 % IJ SOLN
INTRAMUSCULAR | Status: AC
  Administered 2015-11-20: 09:00:00
  Filled 2015-11-20: qty 30

## 2015-11-20 MED ORDER — TRIAMCINOLONE ACETONIDE 40 MG/ML IJ SUSP: 40.0000 mg | Freq: Once | INTRAMUSCULAR | Status: DC

## 2015-11-20 MED ORDER — FENTANYL CITRATE (PF) 100 MCG/2ML IJ SOLN: 100.0000 ug | Freq: Once | INTRAMUSCULAR | Status: DC

## 2015-11-20 MED ORDER — ORPHENADRINE CITRATE 30 MG/ML IJ SOLN
INTRAMUSCULAR | Status: AC
  Administered 2015-11-20: 10:00:00
  Filled 2015-11-20: qty 2

## 2015-11-20 NOTE — Patient Instructions (Addendum)
PLAN  Continue present medication oxycodone and MS Contin and begin taking Ceftin antibiotic today as prescribed  F/U PCP Dr. Thedore MinsSingh  or evaliation of  BP and general medical  condition. Please see Dr. Thedore MinsSingh this week for evaluation of elevated blood pressure  F/U surgical evaluation as discussed  F/U neurological evaluation. May consider pending follow-up evaluations  May consider radiofrequency rhizolysis or intraspinal procedures pending response to present treatment and F/U evaluation   Patient to call Pain Management Center should patient have concerns prior to scheduled return appointmentPain Management Discharge Instructions  General Discharge Instructions :  If you need to reach your doctor call: Monday-Friday 8:00 am - 4:00 pm at [phone] or toll free [phone].  After clinic hours [phone] to have operator reach doctor.  Bring all of your medication bottles to all your appointments in the pain clinic.  To cancel or reschedule your appointment with Pain Management please remember to call 24 hours in advance to avoid a fee.  Refer to the educational materials which you have been given on: General Risks, I had my Procedure. Discharge Instructions, Post Sedation.  Post Procedure Instructions:  The drugs you were given will stay in your system until tomorrow, so for the next 24 hours you should not drive, make any legal decisions or drink any alcoholic beverages.  You may eat anything you prefer, but it is better to start with liquids then soups and crackers, and gradually work up to solid foods.  Please notify your doctor immediately if you have any unusual bleeding, trouble breathing or pain that is not related to your normal pain.  Depending on the type of procedure that was done, some parts of your body may feel week and/or numb.  This usually clears up by tonight or the next day.  Walk with the use of an assistive device or accompanied by an adult for the 24  hours.  You may use ice on the affected area for the first 24 hours.  Put ice in a Ziploc bag and cover with a towel and place against area 15 minutes on 15 minutes off.  You may switch to heat after 24 hours.
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
## 2015-11-20 NOTE — Progress Notes (Signed)
Safety precautions to be maintained throughout the outpatient stay will include: orient to surroundings, keep bed in low position, maintain call bell within reach at all times, provide assistance with transfer out of bed and ambulation.  

## 2015-11-21 ENCOUNTER — Telehealth: Payer: Self-pay | Admitting: *Deleted

## 2015-11-21 NOTE — Telephone Encounter (Signed)
Spoke with Anthony Landry, denies any complications or concerns re; procedure on yesterday.

## 2015-12-05 ENCOUNTER — Encounter: Payer: Self-pay | Admitting: Pain Medicine

## 2015-12-05 ENCOUNTER — Ambulatory Visit: Payer: PPO | Attending: Pain Medicine | Admitting: Pain Medicine

## 2015-12-05 VITALS — BP 123/83 | HR 82 | Temp 97.7°F | Resp 18 | Ht 68.0 in | Wt 165.0 lb

## 2015-12-05 DIAGNOSIS — M5481 Occipital neuralgia: Secondary | ICD-10-CM

## 2015-12-05 DIAGNOSIS — G039 Meningitis, unspecified: Secondary | ICD-10-CM

## 2015-12-05 DIAGNOSIS — M4806 Spinal stenosis, lumbar region: Secondary | ICD-10-CM | POA: Insufficient documentation

## 2015-12-05 DIAGNOSIS — M5136 Other intervertebral disc degeneration, lumbar region: Secondary | ICD-10-CM | POA: Diagnosis not present

## 2015-12-05 DIAGNOSIS — M791 Myalgia: Secondary | ICD-10-CM | POA: Diagnosis not present

## 2015-12-05 DIAGNOSIS — M47812 Spondylosis without myelopathy or radiculopathy, cervical region: Secondary | ICD-10-CM

## 2015-12-05 DIAGNOSIS — Z9889 Other specified postprocedural states: Secondary | ICD-10-CM | POA: Diagnosis not present

## 2015-12-05 DIAGNOSIS — M533 Sacrococcygeal disorders, not elsewhere classified: Secondary | ICD-10-CM | POA: Insufficient documentation

## 2015-12-05 DIAGNOSIS — M47817 Spondylosis without myelopathy or radiculopathy, lumbosacral region: Secondary | ICD-10-CM | POA: Diagnosis not present

## 2015-12-05 DIAGNOSIS — M545 Low back pain: Secondary | ICD-10-CM | POA: Diagnosis not present

## 2015-12-05 DIAGNOSIS — M47816 Spondylosis without myelopathy or radiculopathy, lumbar region: Secondary | ICD-10-CM

## 2015-12-05 DIAGNOSIS — M5416 Radiculopathy, lumbar region: Secondary | ICD-10-CM | POA: Diagnosis not present

## 2015-12-05 DIAGNOSIS — M79606 Pain in leg, unspecified: Secondary | ICD-10-CM | POA: Diagnosis not present

## 2015-12-05 DIAGNOSIS — Z981 Arthrodesis status: Secondary | ICD-10-CM

## 2015-12-05 DIAGNOSIS — M5030 Other cervical disc degeneration, unspecified cervical region: Secondary | ICD-10-CM

## 2015-12-05 MED ORDER — OXYCODONE HCL 15 MG PO TABS: ORAL_TABLET | ORAL | Status: DC

## 2015-12-05 MED ORDER — MORPHINE SULFATE ER 60 MG PO TBCR: EXTENDED_RELEASE_TABLET | ORAL | Status: DC

## 2015-12-05 NOTE — Progress Notes (Signed)
Safety precautions to be maintained throughout the outpatient stay will include: orient to surroundings, keep bed in low position, maintain call bell within reach at all times, provide assistance with transfer out of bed and ambulation.  

## 2015-12-05 NOTE — Patient Instructions (Addendum)
PLAN  Continue present medication oxycodone and MS Contin  Lumbar facet, medial branch nerve blocks to be performed at time return appointment  F/U PCP Dr. Thedore MinsSingh  or evaliation of  BP and general medical  condition  F/U surgical evaluation as discussed. Patient without desire to consider surgery at this time  F/U neurological evaluation. May consider PNCV/EMG studies and other studies pending follow-up evaluations  We are awaiting insurance approval for radiofrequency lumbar facets  Patient to call Pain Management Center should patient have concerns prior to scheduled return appointmentasGENERAL RISKS AND COMPLICATIONS  What are the risk, side effects and possible complications? Generally speaking, most procedures are safe.  However, with any procedure there are risks, side effects, and the possibility of complications.  The risks and complications are dependent upon the sites that are lesioned, or the type of nerve block to be performed.  The closer the procedure is to the spine, the more serious the risks are.  Great care is taken when placing the radio frequency needles, block needles or lesioning probes, but sometimes complications can occur. 1. Infection: Any time there is an injection through the skin, there is a risk of infection.  This is why sterile conditions are used for these blocks.  There are four possible types of infection. 1. Localized skin infection. 2. Central Nervous System Infection-This can be in the form of Meningitis, which can be deadly. 3. Epidural Infections-This can be in the form of an epidural abscess, which can cause pressure inside of the spine, causing compression of the spinal cord with subsequent paralysis. This would require an emergency surgery to decompress, and there are no guarantees that the patient would recover from the paralysis. 4. Discitis-This is an infection of the intervertebral discs.  It occurs in about 1% of discography procedures.  It is  difficult to treat and it may lead to surgery.        2. Pain: the needles have to go through skin and soft tissues, will cause soreness.       3. Damage to internal structures:  The nerves to be lesioned may be near blood vessels or    other nerves which can be potentially damaged.       4. Bleeding: Bleeding is more common if the patient is taking blood thinners such as  aspirin, Coumadin, Ticiid, Plavix, etc., or if he/she have some genetic predisposition  such as hemophilia. Bleeding into the spinal canal can cause compression of the spinal  cord with subsequent paralysis.  This would require an emergency surgery to  decompress and there are no guarantees that the patient would recover from the  paralysis.       5. Pneumothorax:  Puncturing of a lung is a possibility, every time a needle is introduced in  the area of the chest or upper back.  Pneumothorax refers to free air around the  collapsed lung(s), inside of the thoracic cavity (chest cavity).  Another two possible  complications related to a similar event would include: Hemothorax and Chylothorax.   These are variations of the Pneumothorax, where instead of air around the collapsed  lung(s), you may have blood or chyle, respectively.       6. Spinal headaches: They may occur with any procedures in the area of the spine.       7. Persistent CSF (Cerebro-Spinal Fluid) leakage: This is a rare problem, but may occur  with prolonged intrathecal or epidural catheters either due to the formation of  a fistulous  track or a dural tear.       8. Nerve damage: By working so close to the spinal cord, there is always a possibility of  nerve damage, which could be as serious as a permanent spinal cord injury with  paralysis.       9. Death:  Although rare, severe deadly allergic reactions known as "Anaphylactic  reaction" can occur to any of the medications used.      10. Worsening of the symptoms:  We can always make thing worse.  What are the chances of  something like this happening? Chances of any of this occuring are extremely low.  By statistics, you have more of a chance of getting killed in a motor vehicle accident: while driving to the hospital than any of the above occurring .  Nevertheless, you should be aware that they are possibilities.  In general, it is similar to taking a shower.  Everybody knows that you can slip, hit your head and get killed.  Does that mean that you should not shower again?  Nevertheless always keep in mind that statistics do not mean anything if you happen to be on the wrong side of them.  Even if a procedure has a 1 (one) in a 1,000,000 (million) chance of going wrong, it you happen to be that one..Also, keep in mind that by statistics, you have more of a chance of having something go wrong when taking medications.  Who should not have this procedure? If you are on a blood thinning medication (e.g. Coumadin, Plavix, see list of "Blood Thinners"), or if you have an active infection going on, you should not have the procedure.  If you are taking any blood thinners, please inform your physician.  How should I prepare for this procedure?  Do not eat or drink anything at least six hours prior to the procedure.  Bring a driver with you .  It cannot be a taxi.  Come accompanied by an adult that can drive you back, and that is strong enough to help you if your legs get weak or numb from the local anesthetic.  Take all of your medicines the morning of the procedure with just enough water to swallow them.  If you have diabetes, make sure that you are scheduled to have your procedure done first thing in the morning, whenever possible.  If you have diabetes, take only half of your insulin dose and notify our nurse that you have done so as soon as you arrive at the clinic.  If you are diabetic, but only take blood sugar pills (oral hypoglycemic), then do not take them on the morning of your procedure.  You may take them  after you have had the procedure.  Do not take aspirin or any aspirin-containing medications, at least eleven (11) days prior to the procedure.  They may prolong bleeding.  Wear loose fitting clothing that may be easy to take off and that you would not mind if it got stained with Betadine or blood.  Do not wear any jewelry or perfume  Remove any nail coloring.  It will interfere with some of our monitoring equipment.  NOTE: Remember that this is not meant to be interpreted as a complete list of all possible complications.  Unforeseen problems may occur.  BLOOD THINNERS The following drugs contain aspirin or other products, which can cause increased bleeding during surgery and should not be taken for 2 weeks prior to and 1  week after surgery.  If you should need take something for relief of minor pain, you may take acetaminophen which is found in Tylenol,m Datril, Anacin-3 and Panadol. It is not blood thinner. The products listed below are.  Do not take any of the products listed below in addition to any listed on your instruction sheet.  A.P.C or A.P.C with Codeine Codeine Phosphate Capsules #3 Ibuprofen Ridaura  ABC compound Congesprin Imuran rimadil  Advil Cope Indocin Robaxisal  Alka-Seltzer Effervescent Pain Reliever and Antacid Coricidin or Coricidin-D  Indomethacin Rufen  Alka-Seltzer plus Cold Medicine Cosprin Ketoprofen S-A-C Tablets  Anacin Analgesic Tablets or Capsules Coumadin Korlgesic Salflex  Anacin Extra Strength Analgesic tablets or capsules CP-2 Tablets Lanoril Salicylate  Anaprox Cuprimine Capsules Levenox Salocol  Anexsia-D Dalteparin Magan Salsalate  Anodynos Darvon compound Magnesium Salicylate Sine-off  Ansaid Dasin Capsules Magsal Sodium Salicylate  Anturane Depen Capsules Marnal Soma  APF Arthritis pain formula Dewitt's Pills Measurin Stanback  Argesic Dia-Gesic Meclofenamic Sulfinpyrazone  Arthritis Bayer Timed Release Aspirin Diclofenac Meclomen Sulindac   Arthritis pain formula Anacin Dicumarol Medipren Supac  Analgesic (Safety coated) Arthralgen Diffunasal Mefanamic Suprofen  Arthritis Strength Bufferin Dihydrocodeine Mepro Compound Suprol  Arthropan liquid Dopirydamole Methcarbomol with Aspirin Synalgos  ASA tablets/Enseals Disalcid Micrainin Tagament  Ascriptin Doan's Midol Talwin  Ascriptin A/D Dolene Mobidin Tanderil  Ascriptin Extra Strength Dolobid Moblgesic Ticlid  Ascriptin with Codeine Doloprin or Doloprin with Codeine Momentum Tolectin  Asperbuf Duoprin Mono-gesic Trendar  Aspergum Duradyne Motrin or Motrin IB Triminicin  Aspirin plain, buffered or enteric coated Durasal Myochrisine Trigesic  Aspirin Suppositories Easprin Nalfon Trillsate  Aspirin with Codeine Ecotrin Regular or Extra Strength Naprosyn Uracel  Atromid-S Efficin Naproxen Ursinus  Auranofin Capsules Elmiron Neocylate Vanquish  Axotal Emagrin Norgesic Verin  Azathioprine Empirin or Empirin with Codeine Normiflo Vitamin E  Azolid Emprazil Nuprin Voltaren  Bayer Aspirin plain, buffered or children's or timed BC Tablets or powders Encaprin Orgaran Warfarin Sodium  Buff-a-Comp Enoxaparin Orudis Zorpin  Buff-a-Comp with Codeine Equegesic Os-Cal-Gesic   Buffaprin Excedrin plain, buffered or Extra Strength Oxalid   Bufferin Arthritis Strength Feldene Oxphenbutazone   Bufferin plain or Extra Strength Feldene Capsules Oxycodone with Aspirin   Bufferin with Codeine Fenoprofen Fenoprofen Pabalate or Pabalate-SF   Buffets II Flogesic Panagesic   Buffinol plain or Extra Strength Florinal or Florinal with Codeine Panwarfarin   Buf-Tabs Flurbiprofen Penicillamine   Butalbital Compound Four-way cold tablets Penicillin   Butazolidin Fragmin Pepto-Bismol   Carbenicillin Geminisyn Percodan   Carna Arthritis Reliever Geopen Persantine   Carprofen Gold's salt Persistin   Chloramphenicol Goody's Phenylbutazone   Chloromycetin Haltrain Piroxlcam   Clmetidine heparin Plaquenil    Cllnoril Hyco-pap Ponstel   Clofibrate Hydroxy chloroquine Propoxyphen         Before stopping any of these medications, be sure to consult the physician who ordered them.  Some, such as Coumadin (Warfarin) are ordered to prevent or treat serious conditions such as "deep thrombosis", "pumonary embolisms", and other heart problems.  The amount of time that you may need off of the medication may also vary with the medication and the reason for which you were taking it.  If you are taking any of these medications, please make sure you notify your pain physician before you undergo any procedures.         Pain Management Discharge Instructions  General Discharge Instructions :  If you need to reach your doctor call: Monday-Friday 8:00 am - 4:00 pm at [phone] or toll free [phone].  After clinic hours [phone] to have operator reach doctor.  Bring all of your medication bottles to all your appointments in the pain clinic.  To cancel or reschedule your appointment with Pain Management please remember to call 24 hours in advance to avoid a fee.  Refer to the educational materials which you have been given on: General Risks, I had my Procedure. Discharge Instructions, Post Sedation.  Post Procedure Instructions:  The drugs you were given will stay in your system until tomorrow, so for the next 24 hours you should not drive, make any legal decisions or drink any alcoholic beverages.  You may eat anything you prefer, but it is better to start with liquids then soups and crackers, and gradually work up to solid foods.  Please notify your doctor immediately if you have any unusual bleeding, trouble breathing or pain that is not related to your normal pain.  Depending on the type of procedure that was done, some parts of your body may feel week and/or numb.  This usually clears up by tonight or the next day.  Walk with the use of an assistive device or accompanied by an adult  for the 24 hours.  You may use ice on the affected area for the first 24 hours.  Put ice in a Ziploc bag and cover with a towel and place against area 15 minutes on 15 minutes off.  You may switch to heat after 24 hours.Facet Joint Block The facet joints connect the bones of the spine (vertebrae). They make it possible for you to bend, twist, and make other movements with your spine. They also prevent you from overbending, overtwisting, and making other excessive movements.  A facet joint block is a procedure where a numbing medicine (anesthetic) is injected into a facet joint. Often, a type of anti-inflammatory medicine called a steroid is also injected. A facet joint block may be done for two reasons:  2. Diagnosis. A facet joint block may be done as a test to see whether neck or back pain is caused by a worn-down or infected facet joint. If the pain gets better after a facet joint block, it means the pain is probably coming from the facet joint. If the pain does not get better, it means the pain is probably not coming from the facet joint.  3. Therapy. A facet joint block may be done to relieve neck or back pain caused by a facet joint. A facet joint block is only done as a therapy if the pain does not improve with medicine, exercise programs, physical therapy, and other forms of pain management. LET [facility] CARE PROVIDER KNOW ABOUT:   Any allergies you have.   All medicines you are taking, including vitamins, herbs, eyedrops, and over-the-counter medicines and creams.   Previous problems you or members of your family have had with the use of anesthetics.   Any blood disorders you have had.   Other health problems you have. RISKS AND COMPLICATIONS Generally, having a facet joint block is safe. However, as with any procedure, complications can occur. Possible complications associated with having a facet joint block include:   Bleeding.   Injury to a nerve near the injection site.    Pain at the injection site.   Weakness or numbness in areas controlled by nerves near the injection site.   Infection.   Temporary fluid retention.   Allergic reaction to anesthetics or medicines used during the procedure. BEFORE THE PROCEDURE   Follow your  health care provider's instructions if you are taking dietary supplements or medicines. You may need to stop taking them or reduce your dosage.   Do not take any new dietary supplements or medicines without asking your health care provider first.   Follow your health care provider's instructions about eating and drinking before the procedure. You may need to stop eating and drinking several hours before the procedure.   Arrange to have an adult drive you home after the procedure. PROCEDURE 12. You may need to remove your clothing and dress in an open-back gown so that your health care provider can access your spine.  13. The procedure will be done while you are lying on an X-ray table. Most of the time you will be asked to lie on your stomach, but you may be asked to lie in a different position if an injection will be made in your neck.  14. Special machines will be used to monitor your oxygen levels, heart rate, and blood pressure.  15. If an injection will be made in your neck, an intravenous (IV) tube will be inserted into one of your veins. Fluids and medicine will flow directly into your body through the IV tube.  16. The area over the facet joint where the injection will be made will be cleaned with an antiseptic soap. The surrounding skin will be covered with sterile drapes.  17. An anesthetic will be applied to your skin to make the injection area numb. You may feel a temporary stinging or burning sensation.  18. A video X-ray machine will be used to locate the joint. A contrast dye may be injected into the facet joint area to help with locating the joint.  19. When the joint is located, an anesthetic medicine  will be injected into the joint through the needle.  55. Your health care provider will ask you whether you feel pain relief. If you do feel relief, a steroid may be injected to provide pain relief for a longer period of time. If you do not feel relief or feel only partial relief, additional injections of an anesthetic may be made in other facet joints.  21. The needle will be removed, the skin will be cleansed, and bandages will be applied.  AFTER THE PROCEDURE   You will be observed for 15-30 minutes before being allowed to go home. Do not drive. Have an adult drive you or take a taxi or public transportation instead.   If you feel pain relief, the pain will return in several hours or days when the anesthetic wears off.   You may feel pain relief 2-14 days after the procedure. The amount of time this relief lasts varies from person to person.   It is normal to feel some tenderness over the injected area(s) for 2 days following the procedure.   If you have diabetes, you may have a temporary increase in blood sugar.   This information is not intended to replace advice given to you by your health care provider. Make sure you discuss any questions you have with your health care provider.   Document Released: 12/09/2006 Document Revised: 08/10/2014 Document Reviewed: 05/09/2012 Elsevier Interactive Patient Education [employer].
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
## 2015-12-16 ENCOUNTER — Ambulatory Visit: Payer: PPO | Attending: Pain Medicine | Admitting: Pain Medicine

## 2015-12-16 ENCOUNTER — Encounter: Payer: Self-pay | Admitting: Pain Medicine

## 2015-12-16 VITALS — BP 110/78 | HR 51 | Temp 97.8°F | Resp 10 | Ht 68.0 in | Wt 165.0 lb

## 2015-12-16 DIAGNOSIS — Z9889 Other specified postprocedural states: Secondary | ICD-10-CM | POA: Diagnosis not present

## 2015-12-16 DIAGNOSIS — M5030 Other cervical disc degeneration, unspecified cervical region: Secondary | ICD-10-CM

## 2015-12-16 DIAGNOSIS — G039 Meningitis, unspecified: Secondary | ICD-10-CM

## 2015-12-16 DIAGNOSIS — M545 Low back pain: Secondary | ICD-10-CM | POA: Insufficient documentation

## 2015-12-16 DIAGNOSIS — M533 Sacrococcygeal disorders, not elsewhere classified: Secondary | ICD-10-CM

## 2015-12-16 DIAGNOSIS — M47816 Spondylosis without myelopathy or radiculopathy, lumbar region: Secondary | ICD-10-CM

## 2015-12-16 DIAGNOSIS — M5136 Other intervertebral disc degeneration, lumbar region: Secondary | ICD-10-CM | POA: Diagnosis not present

## 2015-12-16 DIAGNOSIS — M5481 Occipital neuralgia: Secondary | ICD-10-CM

## 2015-12-16 DIAGNOSIS — M47812 Spondylosis without myelopathy or radiculopathy, cervical region: Secondary | ICD-10-CM

## 2015-12-16 DIAGNOSIS — M79606 Pain in leg, unspecified: Secondary | ICD-10-CM | POA: Diagnosis not present

## 2015-12-16 DIAGNOSIS — Z981 Arthrodesis status: Secondary | ICD-10-CM

## 2015-12-16 DIAGNOSIS — M47817 Spondylosis without myelopathy or radiculopathy, lumbosacral region: Secondary | ICD-10-CM | POA: Diagnosis not present

## 2015-12-16 MED ORDER — MIDAZOLAM HCL 5 MG/5ML IJ SOLN: 5.0000 mg | Freq: Once | INTRAMUSCULAR | Status: DC

## 2015-12-16 MED ORDER — FENTANYL CITRATE (PF) 100 MCG/2ML IJ SOLN: 100.0000 ug | Freq: Once | INTRAMUSCULAR | Status: DC

## 2015-12-16 MED ORDER — FENTANYL CITRATE (PF) 100 MCG/2ML IJ SOLN
INTRAMUSCULAR | Status: AC
  Administered 2015-12-16: 100 ug via INTRAVENOUS
  Filled 2015-12-16: qty 2

## 2015-12-16 MED ORDER — ORPHENADRINE CITRATE 30 MG/ML IJ SOLN
INTRAMUSCULAR | Status: AC
  Administered 2015-12-16: 12:00:00
  Filled 2015-12-16: qty 2

## 2015-12-16 MED ORDER — BUPIVACAINE HCL (PF) 0.25 % IJ SOLN
INTRAMUSCULAR | Status: AC
  Administered 2015-12-16: 12:00:00
  Filled 2015-12-16: qty 30

## 2015-12-16 MED ORDER — CEFAZOLIN SODIUM 1 G IJ SOLR
INTRAMUSCULAR | Status: AC
  Administered 2015-12-16: 12:00:00
  Filled 2015-12-16: qty 10

## 2015-12-16 MED ORDER — MIDAZOLAM HCL 5 MG/5ML IJ SOLN
INTRAMUSCULAR | Status: AC
  Administered 2015-12-16: 5 mg via INTRAVENOUS
  Filled 2015-12-16: qty 5

## 2015-12-16 MED ORDER — ORPHENADRINE CITRATE 30 MG/ML IJ SOLN: 60.0000 mg | Freq: Once | INTRAMUSCULAR | Status: DC

## 2015-12-16 MED ORDER — CEFAZOLIN SODIUM 1-5 GM-% IV SOLN
1.0000 g | Freq: Once | INTRAVENOUS | Status: AC
  Administered 2015-12-16: 1 g via INTRAVENOUS

## 2015-12-16 MED ORDER — TRIAMCINOLONE ACETONIDE 40 MG/ML IJ SUSP: 40.0000 mg | Freq: Once | INTRAMUSCULAR | Status: DC

## 2015-12-16 MED ORDER — CEFUROXIME AXETIL 250 MG PO TABS: 250.0000 mg | ORAL_TABLET | Freq: Two times a day (BID) | ORAL | Status: DC

## 2015-12-16 MED ORDER — BUPIVACAINE HCL (PF) 0.25 % IJ SOLN
30.0000 mL | Freq: Once | INTRAMUSCULAR | Status: AC
  Administered 2015-12-16: 30 mL

## 2015-12-16 MED ORDER — LACTATED RINGERS IV SOLN: 1000.0000 mL | INTRAVENOUS | Status: DC

## 2015-12-16 MED ORDER — TRIAMCINOLONE ACETONIDE 40 MG/ML IJ SUSP
INTRAMUSCULAR | Status: AC
  Administered 2015-12-16: 12:00:00
  Filled 2015-12-16: qty 1

## 2015-12-16 NOTE — Progress Notes (Signed)
Safety precautions to be maintained throughout the outpatient stay will include: orient to surroundings, keep bed in low position, maintain call bell within reach at all times, provide assistance with transfer out of bed and ambulation.  

## 2015-12-16 NOTE — Patient Instructions (Addendum)
PLAN  Continue present medication oxycodone and MS Contin. Please obtain Ceftin antibiotic today and begin taking Ceftin antibiotic today as prescribed  F/U PCP Dr. Thedore Mins  or evaliation of  BP and general medical  Condition.  F/U surgical evaluation as discussed   F/U  neurological evaluation. May consider [facility] EMG studies and other studies  May consider radiofrequency rhizolysis or intraspinal procedures pending response to present treatment and F/U evaluation   Patient to call [facility] should patient have concerns prior to scheduled return appointmentGENERAL RISKS AND COMPLICATIONS  What are the risk, side effects and possible complications? Generally speaking, most procedures are safe.  However, with any procedure there are risks, side effects, and the possibility of complications.  The risks and complications are dependent upon the sites that are lesioned, or the type of nerve block to be performed.  The closer the procedure is to the spine, the more serious the risks are.  Great care is taken when placing the radio frequency needles, block needles or lesioning probes, but sometimes complications can occur. 1. Infection: Any time there is an injection through the skin, there is a risk of infection.  This is why sterile conditions are used for these blocks.  There are four possible types of infection. 1. Localized skin infection. 2. Central Nervous System Infection-This can be in the form of Meningitis, which can be deadly. 3. Epidural Infections-This can be in the form of an epidural abscess, which can cause pressure inside of the spine, causing compression of the spinal cord with subsequent paralysis. This would require an emergency surgery to decompress, and there are no guarantees that the patient would recover from the paralysis. 4. Discitis-This is an infection of the intervertebral discs.  It occurs in about 1% of discography procedures.  It is difficult to treat and it  may lead to surgery.        2. Pain: the needles have to go through skin and soft tissues, will cause soreness.       3. Damage to internal structures:  The nerves to be lesioned may be near blood vessels or    other nerves which can be potentially damaged.       4. Bleeding: Bleeding is more common if the patient is taking blood thinners such as  aspirin, Coumadin, Ticiid, Plavix, etc., or if he/she have some genetic predisposition  such as hemophilia. Bleeding into the spinal canal can cause compression of the spinal  cord with subsequent paralysis.  This would require an emergency surgery to  decompress and there are no guarantees that the patient would recover from the  paralysis.       5. Pneumothorax:  Puncturing of a lung is a possibility, every time a needle is introduced in  the area of the chest or upper back.  Pneumothorax refers to free air around the  collapsed lung(s), inside of the thoracic cavity (chest cavity).  Another two possible  complications related to a similar event would include: Hemothorax and Chylothorax.   These are variations of the Pneumothorax, where instead of air around the collapsed  lung(s), you may have blood or chyle, respectively.       6. Spinal headaches: They may occur with any procedures in the area of the spine.       7. Persistent CSF (Cerebro-Spinal Fluid) leakage: This is a rare problem, but may occur  with prolonged intrathecal or epidural catheters either due to the formation of a fistulous  track or a dural tear.       8. Nerve damage: By working so close to the spinal cord, there is always a possibility of  nerve damage, which could be as serious as a permanent spinal cord injury with  paralysis.       9. Death:  Although rare, severe deadly allergic reactions known as "Anaphylactic  reaction" can occur to any of the medications used.      10. Worsening of the symptoms:  We can always make thing worse.  What are the chances of something like this  happening? Chances of any of this occuring are extremely low.  By statistics, you have more of a chance of getting killed in a motor vehicle accident: while driving to the hospital than any of the above occurring .  Nevertheless, you should be aware that they are possibilities.  In general, it is similar to taking a shower.  Everybody knows that you can slip, hit your head and get killed.  Does that mean that you should not shower again?  Nevertheless always keep in mind that statistics do not mean anything if you happen to be on the wrong side of them.  Even if a procedure has a 1 (one) in a 1,000,000 (million) chance of going wrong, it you happen to be that one..Also, keep in mind that by statistics, you have more of a chance of having something go wrong when taking medications.  Who should not have this procedure? If you are on a blood thinning medication (e.g. Coumadin, Plavix, see list of "Blood Thinners"), or if you have an active infection going on, you should not have the procedure.  If you are taking any blood thinners, please inform your physician.  How should I prepare for this procedure?  Do not eat or drink anything at least six hours prior to the procedure.  Bring a driver with you .  It cannot be a taxi.  Come accompanied by an adult that can drive you back, and that is strong enough to help you if your legs get weak or numb from the local anesthetic.  Take all of your medicines the morning of the procedure with just enough water to swallow them.  If you have diabetes, make sure that you are scheduled to have your procedure done first thing in the morning, whenever possible.  If you have diabetes, take only half of your insulin dose and notify our nurse that you have done so as soon as you arrive at the clinic.  If you are diabetic, but only take blood sugar pills (oral hypoglycemic), then do not take them on the morning of your procedure.  You may take them after you have had the  procedure.  Do not take aspirin or any aspirin-containing medications, at least eleven (11) days prior to the procedure.  They may prolong bleeding.  Wear loose fitting clothing that may be easy to take off and that you would not mind if it got stained with Betadine or blood.  Do not wear any jewelry or perfume  Remove any nail coloring.  It will interfere with some of our monitoring equipment.  NOTE: Remember that this is not meant to be interpreted as a complete list of all possible complications.  Unforeseen problems may occur.  BLOOD THINNERS The following drugs contain aspirin or other products, which can cause increased bleeding during surgery and should not be taken for 2 weeks prior to and 1 week after surgery.  If you should need take something for relief of minor pain, you may take acetaminophen which is found in Tylenol,m Datril, Anacin-3 and Panadol. It is not blood thinner. The products listed below are.  Do not take any of the products listed below in addition to any listed on your instruction sheet.  A.P.C or A.P.C with Codeine Codeine Phosphate Capsules #3 Ibuprofen Ridaura  ABC compound Congesprin Imuran rimadil  Advil Cope Indocin Robaxisal  Alka-Seltzer Effervescent Pain Reliever and Antacid Coricidin or Coricidin-D  Indomethacin Rufen  Alka-Seltzer plus Cold Medicine Cosprin Ketoprofen S-A-C Tablets  Anacin Analgesic Tablets or Capsules Coumadin Korlgesic Salflex  Anacin Extra Strength Analgesic tablets or capsules CP-2 Tablets Lanoril Salicylate  Anaprox Cuprimine Capsules Levenox Salocol  Anexsia-D Dalteparin Magan Salsalate  Anodynos Darvon compound Magnesium Salicylate Sine-off  Ansaid Dasin Capsules Magsal Sodium Salicylate  Anturane Depen Capsules Marnal Soma  APF Arthritis pain formula Dewitt's Pills Measurin Stanback  Argesic Dia-Gesic Meclofenamic Sulfinpyrazone  Arthritis Bayer Timed Release Aspirin Diclofenac Meclomen Sulindac  Arthritis pain formula  Anacin Dicumarol Medipren Supac  Analgesic (Safety coated) Arthralgen Diffunasal Mefanamic Suprofen  Arthritis Strength Bufferin Dihydrocodeine Mepro Compound Suprol  Arthropan liquid Dopirydamole Methcarbomol with Aspirin Synalgos  ASA tablets/Enseals Disalcid Micrainin Tagament  Ascriptin Doan's Midol Talwin  Ascriptin A/D Dolene Mobidin Tanderil  Ascriptin Extra Strength Dolobid Moblgesic Ticlid  Ascriptin with Codeine Doloprin or Doloprin with Codeine Momentum Tolectin  Asperbuf Duoprin Mono-gesic Trendar  Aspergum Duradyne Motrin or Motrin IB Triminicin  Aspirin plain, buffered or enteric coated Durasal Myochrisine Trigesic  Aspirin Suppositories Easprin Nalfon Trillsate  Aspirin with Codeine Ecotrin Regular or Extra Strength Naprosyn Uracel  Atromid-S Efficin Naproxen Ursinus  Auranofin Capsules Elmiron Neocylate Vanquish  Axotal Emagrin Norgesic Verin  Azathioprine Empirin or Empirin with Codeine Normiflo Vitamin E  Azolid Emprazil Nuprin Voltaren  Bayer Aspirin plain, buffered or children's or timed BC Tablets or powders Encaprin Orgaran Warfarin Sodium  Buff-a-Comp Enoxaparin Orudis Zorpin  Buff-a-Comp with Codeine Equegesic Os-Cal-Gesic   Buffaprin Excedrin plain, buffered or Extra Strength Oxalid   Bufferin Arthritis Strength Feldene Oxphenbutazone   Bufferin plain or Extra Strength Feldene Capsules Oxycodone with Aspirin   Bufferin with Codeine Fenoprofen Fenoprofen Pabalate or Pabalate-SF   Buffets II Flogesic Panagesic   Buffinol plain or Extra Strength Florinal or Florinal with Codeine Panwarfarin   Buf-Tabs Flurbiprofen Penicillamine   Butalbital Compound Four-way cold tablets Penicillin   Butazolidin Fragmin Pepto-Bismol   Carbenicillin Geminisyn Percodan   Carna Arthritis Reliever Geopen Persantine   Carprofen Gold's salt Persistin   Chloramphenicol Goody's Phenylbutazone   Chloromycetin Haltrain Piroxlcam   Clmetidine heparin Plaquenil   Cllnoril Hyco-pap  Ponstel   Clofibrate Hydroxy chloroquine Propoxyphen         Before stopping any of these medications, be sure to consult the physician who ordered them.  Some, such as Coumadin (Warfarin) are ordered to prevent or treat serious conditions such as "deep thrombosis", "pumonary embolisms", and other heart problems.  The amount of time that you may need off of the medication may also vary with the medication and the reason for which you were taking it.  If you are taking any of these medications, please make sure you notify your pain physician before you undergo any procedures.
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
## 2015-12-17 ENCOUNTER — Telehealth: Payer: Self-pay

## 2015-12-17 NOTE — Telephone Encounter (Signed)
Pt voices no complaints or concerns from procedure yesterday.Stes he is doing OK.

## 2015-12-31 ENCOUNTER — Encounter: Payer: Self-pay | Admitting: Pain Medicine

## 2015-12-31 ENCOUNTER — Ambulatory Visit: Payer: PPO | Attending: Pain Medicine | Admitting: Pain Medicine

## 2015-12-31 VITALS — BP 127/84 | HR 59 | Temp 98.9°F | Resp 16 | Ht 68.0 in | Wt 160.0 lb

## 2015-12-31 DIAGNOSIS — M79604 Pain in right leg: Secondary | ICD-10-CM | POA: Diagnosis not present

## 2015-12-31 DIAGNOSIS — M5030 Other cervical disc degeneration, unspecified cervical region: Secondary | ICD-10-CM

## 2015-12-31 DIAGNOSIS — M5481 Occipital neuralgia: Secondary | ICD-10-CM

## 2015-12-31 DIAGNOSIS — M79605 Pain in left leg: Secondary | ICD-10-CM | POA: Diagnosis not present

## 2015-12-31 DIAGNOSIS — M5136 Other intervertebral disc degeneration, lumbar region: Secondary | ICD-10-CM | POA: Diagnosis not present

## 2015-12-31 DIAGNOSIS — Z9889 Other specified postprocedural states: Secondary | ICD-10-CM | POA: Diagnosis not present

## 2015-12-31 DIAGNOSIS — Z87828 Personal history of other (healed) physical injury and trauma: Secondary | ICD-10-CM | POA: Insufficient documentation

## 2015-12-31 DIAGNOSIS — Z981 Arthrodesis status: Secondary | ICD-10-CM

## 2015-12-31 DIAGNOSIS — M545 Low back pain: Secondary | ICD-10-CM | POA: Diagnosis not present

## 2015-12-31 DIAGNOSIS — M461 Sacroiliitis, not elsewhere classified: Secondary | ICD-10-CM | POA: Diagnosis not present

## 2015-12-31 DIAGNOSIS — G039 Meningitis, unspecified: Secondary | ICD-10-CM

## 2015-12-31 DIAGNOSIS — M47816 Spondylosis without myelopathy or radiculopathy, lumbar region: Secondary | ICD-10-CM

## 2015-12-31 DIAGNOSIS — M4806 Spinal stenosis, lumbar region: Secondary | ICD-10-CM | POA: Diagnosis not present

## 2015-12-31 DIAGNOSIS — M533 Sacrococcygeal disorders, not elsewhere classified: Secondary | ICD-10-CM | POA: Diagnosis not present

## 2015-12-31 DIAGNOSIS — M791 Myalgia: Secondary | ICD-10-CM | POA: Diagnosis not present

## 2015-12-31 DIAGNOSIS — M51369 Other intervertebral disc degeneration, lumbar region without mention of lumbar back pain or lower extremity pain: Secondary | ICD-10-CM

## 2015-12-31 DIAGNOSIS — M47812 Spondylosis without myelopathy or radiculopathy, cervical region: Secondary | ICD-10-CM

## 2015-12-31 DIAGNOSIS — M47817 Spondylosis without myelopathy or radiculopathy, lumbosacral region: Secondary | ICD-10-CM | POA: Diagnosis not present

## 2015-12-31 DIAGNOSIS — M5416 Radiculopathy, lumbar region: Secondary | ICD-10-CM | POA: Diagnosis not present

## 2015-12-31 MED ORDER — OXYCODONE HCL 15 MG PO TABS: ORAL_TABLET | ORAL | Status: DC

## 2015-12-31 MED ORDER — MORPHINE SULFATE ER 60 MG PO TBCR: EXTENDED_RELEASE_TABLET | ORAL | Status: DC

## 2015-12-31 NOTE — Progress Notes (Signed)
Safety precautions to be maintained throughout the outpatient stay will include: orient to surroundings, keep bed in low position, maintain call bell within reach at all times, provide assistance with transfer out of bed and ambulation.  
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
## 2015-12-31 NOTE — Patient Instructions (Signed)
PLAN  Continue present medication oxycodone and MS Contin  F/U PCP Dr. Thedore MinsSingh  or evaliation of  BP and general medical  condition  F/U surgical evaluation as discussed. Patient without desire to consider surgery at this time  F/U neurological evaluation. May consider [facility]/EMG studies and other studies pending follow-up evaluations  We are awaiting insurance approval for radiofrequency lumbar facets  Patient to call [facility] should patient have concerns prior to scheduled return appointment

## 2016-01-16 ENCOUNTER — Telehealth: Payer: Self-pay

## 2016-01-16 NOTE — Telephone Encounter (Signed)
Pharmacy just wanted us to know that they believe they gave the patient 40 pills too many. They notified the patient, he agreed and brought 40 tabs back to the pharmacy.

## 2016-01-16 NOTE — Telephone Encounter (Signed)
Pts pharmacy called... The pharmacy done a monthly check of narcotics 40 tablets of oxycodone was missing and it was between Mr. Mal AmabileBrock and another person. Mr Mal AmabileBrock felt like he had extra so he brought 40 tablets back to the pharmacy. Tobi Bastosnna wants Dr. Metta Clinesrisp and staff to know just incase he is short then he could have been wrong. Please call Tobi Bastosnna [phone]

## 2016-01-30 ENCOUNTER — Encounter: Payer: Self-pay | Admitting: Pain Medicine

## 2016-01-30 ENCOUNTER — Ambulatory Visit: Payer: PPO | Attending: Pain Medicine | Admitting: Pain Medicine

## 2016-01-30 VITALS — BP 99/73 | HR 57 | Temp 98.2°F | Resp 16 | Ht 68.0 in | Wt 160.0 lb

## 2016-01-30 DIAGNOSIS — Z9889 Other specified postprocedural states: Secondary | ICD-10-CM | POA: Diagnosis not present

## 2016-01-30 DIAGNOSIS — M791 Myalgia: Secondary | ICD-10-CM | POA: Diagnosis not present

## 2016-01-30 DIAGNOSIS — M47812 Spondylosis without myelopathy or radiculopathy, cervical region: Secondary | ICD-10-CM

## 2016-01-30 DIAGNOSIS — M4806 Spinal stenosis, lumbar region: Secondary | ICD-10-CM | POA: Insufficient documentation

## 2016-01-30 DIAGNOSIS — M47816 Spondylosis without myelopathy or radiculopathy, lumbar region: Secondary | ICD-10-CM

## 2016-01-30 DIAGNOSIS — M51369 Other intervertebral disc degeneration, lumbar region without mention of lumbar back pain or lower extremity pain: Secondary | ICD-10-CM

## 2016-01-30 DIAGNOSIS — M545 Low back pain: Secondary | ICD-10-CM | POA: Diagnosis not present

## 2016-01-30 DIAGNOSIS — M533 Sacrococcygeal disorders, not elsewhere classified: Secondary | ICD-10-CM

## 2016-01-30 DIAGNOSIS — G039 Meningitis, unspecified: Secondary | ICD-10-CM

## 2016-01-30 DIAGNOSIS — M79606 Pain in leg, unspecified: Secondary | ICD-10-CM | POA: Diagnosis not present

## 2016-01-30 DIAGNOSIS — M5030 Other cervical disc degeneration, unspecified cervical region: Secondary | ICD-10-CM

## 2016-01-30 DIAGNOSIS — M5136 Other intervertebral disc degeneration, lumbar region: Secondary | ICD-10-CM | POA: Insufficient documentation

## 2016-01-30 DIAGNOSIS — Z981 Arthrodesis status: Secondary | ICD-10-CM

## 2016-01-30 DIAGNOSIS — M5416 Radiculopathy, lumbar region: Secondary | ICD-10-CM | POA: Diagnosis not present

## 2016-01-30 DIAGNOSIS — M461 Sacroiliitis, not elsewhere classified: Secondary | ICD-10-CM | POA: Diagnosis not present

## 2016-01-30 DIAGNOSIS — M5481 Occipital neuralgia: Secondary | ICD-10-CM

## 2016-01-30 DIAGNOSIS — M47817 Spondylosis without myelopathy or radiculopathy, lumbosacral region: Secondary | ICD-10-CM | POA: Diagnosis not present

## 2016-01-30 MED ORDER — MORPHINE SULFATE ER 60 MG PO TBCR: EXTENDED_RELEASE_TABLET | ORAL | Status: DC

## 2016-01-30 MED ORDER — OXYCODONE HCL 15 MG PO TABS: ORAL_TABLET | ORAL | Status: DC

## 2016-01-30 NOTE — Patient Instructions (Signed)
PLAN  Continue present medication oxycodone and MS Contin  F/U PCP Dr. Singh  or evaliation of  BP and general medical  condition  F/U surgical evaluation as discussed. Patient without desire to consider surgery at this time  F/U neurological evaluation. May consider [facility]/EMG studies and other studies pending follow-up evaluations  Patient to call [facility] should patient have concerns prior to scheduled return appointment 

## 2016-01-30 NOTE — Progress Notes (Signed)
Safety precautions to be maintained throughout the outpatient stay will include: orient to surroundings, keep bed in low position, maintain call bell within reach at all times, provide assistance with transfer out of bed and ambulation.  
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
## 2016-02-24 ENCOUNTER — Ambulatory Visit: Payer: PPO | Attending: Pain Medicine | Admitting: Pain Medicine

## 2016-02-24 ENCOUNTER — Encounter: Payer: Self-pay | Admitting: Pain Medicine

## 2016-02-24 VITALS — BP 135/85 | HR 57 | Temp 98.3°F | Resp 16 | Ht 68.0 in | Wt 160.0 lb

## 2016-02-24 DIAGNOSIS — M47817 Spondylosis without myelopathy or radiculopathy, lumbosacral region: Secondary | ICD-10-CM | POA: Diagnosis not present

## 2016-02-24 DIAGNOSIS — M5136 Other intervertebral disc degeneration, lumbar region: Secondary | ICD-10-CM | POA: Diagnosis not present

## 2016-02-24 DIAGNOSIS — Z9889 Other specified postprocedural states: Secondary | ICD-10-CM | POA: Insufficient documentation

## 2016-02-24 DIAGNOSIS — M545 Low back pain: Secondary | ICD-10-CM | POA: Diagnosis not present

## 2016-02-24 DIAGNOSIS — G039 Meningitis, unspecified: Secondary | ICD-10-CM

## 2016-02-24 DIAGNOSIS — M533 Sacrococcygeal disorders, not elsewhere classified: Secondary | ICD-10-CM | POA: Diagnosis not present

## 2016-02-24 DIAGNOSIS — M5416 Radiculopathy, lumbar region: Secondary | ICD-10-CM | POA: Diagnosis not present

## 2016-02-24 DIAGNOSIS — M4806 Spinal stenosis, lumbar region: Secondary | ICD-10-CM | POA: Insufficient documentation

## 2016-02-24 DIAGNOSIS — M5030 Other cervical disc degeneration, unspecified cervical region: Secondary | ICD-10-CM

## 2016-02-24 DIAGNOSIS — M791 Myalgia: Secondary | ICD-10-CM | POA: Diagnosis not present

## 2016-02-24 DIAGNOSIS — M47812 Spondylosis without myelopathy or radiculopathy, cervical region: Secondary | ICD-10-CM

## 2016-02-24 DIAGNOSIS — M461 Sacroiliitis, not elsewhere classified: Secondary | ICD-10-CM | POA: Diagnosis not present

## 2016-02-24 MED ORDER — MORPHINE SULFATE ER 60 MG PO TBCR: EXTENDED_RELEASE_TABLET | ORAL | 0 refills | Status: DC

## 2016-02-24 MED ORDER — OXYCODONE HCL 15 MG PO TABS: ORAL_TABLET | ORAL | 0 refills | Status: DC
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
## 2016-02-24 NOTE — Patient Instructions (Addendum)
PLAN  Continue present medication oxycodone and MS Contin  Lumbar facet, medial branch nerve, blocks to be performed at time of return appointment  F/U PCP Dr. Thedore Mins  or evaliation of  BP and general medical  condition  F/U surgical evaluation as discussed. Patient without desire to consider surgery at this time  F/U neurological evaluation. May consider PNCV/EMG studies and other studies pending follow-up evaluations  Patient to call Pain Management Center should patient have concerns prior to scheduled return appointmentFacet Blocks Patient Information  Description: The facets are joints in the spine between the vertebrae.  Like any joints in the body, facets can become irritated and painful.  Arthritis can also effect the facets.  By injecting steroids and local anesthetic in and around these joints, we can temporarily block the nerve supply to them.  Steroids act directly on irritated nerves and tissues to reduce selling and inflammation which often leads to decreased pain.  Facet blocks may be done anywhere along the spine from the neck to the low back depending upon the location of your pain.   After numbing the skin with local anesthetic (like Novocaine), a small needle is passed onto the facet joints under x-ray guidance.  You may experience a sensation of pressure while this is being done.  The entire block usually lasts about 15-25 minutes.   Conditions which may be treated by facet blocks:   Low back/buttock pain  Neck/shoulder pain  Certain types of headaches  Preparation for the injection:  1. Do not eat any solid food or dairy products within 8 hours of your appointment. 2. You may drink clear liquid up to 3 hours before appointment.  Clear liquids include water, black coffee, juice or soda.  No milk or cream please. 3. You may take your regular medication, including pain medications, with a sip of water before your appointment.  Diabetics should hold regular insulin (if  taken separately) and take 1/2 normal NPH dose the morning of the procedure.  Carry some sugar containing items with you to your appointment. 4. A driver must accompany you and be prepared to drive you home after your procedure. 5. Bring all your current medications with you. 6. An IV may be inserted and sedation may be given at the discretion of the physician. 7. A blood pressure cuff, EKG and other monitors will often be applied during the procedure.  Some patients may need to have extra oxygen administered for a short period. 8. You will be asked to provide medical information, including your allergies and medications, prior to the procedure.  We must know immediately if you are taking blood thinners (like Coumadin/Warfarin) or if you are allergic to IV iodine contrast (dye).  We must know if you could possible be pregnant.  Possible side-effects:   Bleeding from needle site  Infection (rare, may require surgery)  Nerve injury (rare)  Numbness & tingling (temporary)  Difficulty urinating (rare, temporary)  Spinal headache (a headache worse with upright posture)  Light-headedness (temporary)  Pain at injection site (serveral days)  Decreased blood pressure (rare, temporary)  Weakness in arm/leg (temporary)  Pressure sensation in back/neck (temporary)   Call if you experience:   Fever/chills associated with headache or increased back/neck pain  Headache worsened by an upright position  New onset, weakness or numbness of an extremity below the injection site  Hives or difficulty breathing (go to the emergency room)  Inflammation or drainage at the injection site(s)  Severe back/neck pain greater than  usual  New symptoms which are concerning to you  Please note:  Although the local anesthetic injected can often make your back or neck feel good for several hours after the injection, the pain will likely return. It takes 3-7 days for steroids to work.  You may not  notice any pain relief for at least one week.  If effective, we will often do a series of 2-3 injections spaced 3-6 weeks apart to maximally decrease your pain.  After the initial series, you may be a candidate for a more permanent nerve block of the facets.  If you have any questions, please call #[phone] [facility] Clinic[facility] RISKS AND COMPLICATIONS  What are the risk, side effects and possible complications? Generally speaking, most procedures are safe.  However, with any procedure there are risks, side effects, and the possibility of complications.  The risks and complications are dependent upon the sites that are lesioned, or the type of nerve block to be performed.  The closer the procedure is to the spine, the more serious the risks are.  Great care is taken when placing the radio frequency needles, block needles or lesioning probes, but sometimes complications can occur. 1. Infection: Any time there is an injection through the skin, there is a risk of infection.  This is why sterile conditions are used for these blocks.  There are four possible types of infection. 1. Localized skin infection. 2. Central Nervous System Infection-This can be in the form of Meningitis, which can be deadly. 3. Epidural Infections-This can be in the form of an epidural abscess, which can cause pressure inside of the spine, causing compression of the spinal cord with subsequent paralysis. This would require an emergency surgery to decompress, and there are no guarantees that the patient would recover from the paralysis. 4. Discitis-This is an infection of the intervertebral discs.  It occurs in about 1% of discography procedures.  It is difficult to treat and it may lead to surgery.        2. Pain: the needles have to go through skin and soft tissues, will cause soreness.       3. Damage to internal structures:  The nerves to be lesioned may be near blood vessels or    other  nerves which can be potentially damaged.       4. Bleeding: Bleeding is more common if the patient is taking blood thinners such as  aspirin, Coumadin, Ticiid, Plavix, etc., or if he/she have some genetic predisposition  such as hemophilia. Bleeding into the spinal canal can cause compression of the spinal  cord with subsequent paralysis.  This would require an emergency surgery to  decompress and there are no guarantees that the patient would recover from the  paralysis.       5. Pneumothorax:  Puncturing of a lung is a possibility, every time a needle is introduced in  the area of the chest or upper back.  Pneumothorax refers to free air around the  collapsed lung(s), inside of the thoracic cavity (chest cavity).  Another two possible  complications related to a similar event would include: Hemothorax and Chylothorax.   These are variations of the Pneumothorax, where instead of air around the collapsed  lung(s), you may have blood or chyle, respectively.       6. Spinal headaches: They may occur with any procedures in the area of the spine.       7. Persistent CSF (Cerebro-Spinal Fluid)  leakage: This is a rare problem, but may occur  with prolonged intrathecal or epidural catheters either due to the formation of a fistulous  track or a dural tear.       8. Nerve damage: By working so close to the spinal cord, there is always a possibility of  nerve damage, which could be as serious as a permanent spinal cord injury with  paralysis.       9. Death:  Although rare, severe deadly allergic reactions known as "Anaphylactic  reaction" can occur to any of the medications used.      10. Worsening of the symptoms:  We can always make thing worse.  What are the chances of something like this happening? Chances of any of this occuring are extremely low.  By statistics, you have more of a chance of getting killed in a motor vehicle accident: while driving to the hospital than any of the above occurring .   Nevertheless, you should be aware that they are possibilities.  In general, it is similar to taking a shower.  Everybody knows that you can slip, hit your head and get killed.  Does that mean that you should not shower again?  Nevertheless always keep in mind that statistics do not mean anything if you happen to be on the wrong side of them.  Even if a procedure has a 1 (one) in a 1,000,000 (million) chance of going wrong, it you happen to be that one..Also, keep in mind that by statistics, you have more of a chance of having something go wrong when taking medications.  Who should not have this procedure? If you are on a blood thinning medication (e.g. Coumadin, Plavix, see list of "Blood Thinners"), or if you have an active infection going on, you should not have the procedure.  If you are taking any blood thinners, please inform your physician.  How should I prepare for this procedure?  Do not eat or drink anything at least six hours prior to the procedure.  Bring a driver with you .  It cannot be a taxi.  Come accompanied by an adult that can drive you back, and that is strong enough to help you if your legs get weak or numb from the local anesthetic.  Take all of your medicines the morning of the procedure with just enough water to swallow them.  If you have diabetes, make sure that you are scheduled to have your procedure done first thing in the morning, whenever possible.  If you have diabetes, take only half of your insulin dose and notify our nurse that you have done so as soon as you arrive at the clinic.  If you are diabetic, but only take blood sugar pills (oral hypoglycemic), then do not take them on the morning of your procedure.  You may take them after you have had the procedure.  Do not take aspirin or any aspirin-containing medications, at least eleven (11) days prior to the procedure.  They may prolong bleeding.  Wear loose fitting clothing that may be easy to take off and  that you would not mind if it got stained with Betadine or blood.  Do not wear any jewelry or perfume  Remove any nail coloring.  It will interfere with some of our monitoring equipment.  NOTE: Remember that this is not meant to be interpreted as a complete list of all possible complications.  Unforeseen problems may occur.  BLOOD THINNERS The following drugs contain aspirin  or other products, which can cause increased bleeding during surgery and should not be taken for 2 weeks prior to and 1 week after surgery.  If you should need take something for relief of minor pain, you may take acetaminophen which is found in Tylenol,m Datril, Anacin-3 and Panadol. It is not blood thinner. The products listed below are.  Do not take any of the products listed below in addition to any listed on your instruction sheet.  A.P.C or A.P.C with Codeine Codeine Phosphate Capsules #3 Ibuprofen Ridaura  ABC compound Congesprin Imuran rimadil  Advil Cope Indocin Robaxisal  Alka-Seltzer Effervescent Pain Reliever and Antacid Coricidin or Coricidin-D  Indomethacin Rufen  Alka-Seltzer plus Cold Medicine Cosprin Ketoprofen S-A-C Tablets  Anacin Analgesic Tablets or Capsules Coumadin Korlgesic Salflex  Anacin Extra Strength Analgesic tablets or capsules CP-2 Tablets Lanoril Salicylate  Anaprox Cuprimine Capsules Levenox Salocol  Anexsia-D Dalteparin Magan Salsalate  Anodynos Darvon compound Magnesium Salicylate Sine-off  Ansaid Dasin Capsules Magsal Sodium Salicylate  Anturane Depen Capsules Marnal Soma  APF Arthritis pain formula Dewitt's Pills Measurin Stanback  Argesic Dia-Gesic Meclofenamic Sulfinpyrazone  Arthritis Bayer Timed Release Aspirin Diclofenac Meclomen Sulindac  Arthritis pain formula Anacin Dicumarol Medipren Supac  Analgesic (Safety coated) Arthralgen Diffunasal Mefanamic Suprofen  Arthritis Strength Bufferin Dihydrocodeine Mepro Compound Suprol  Arthropan liquid Dopirydamole Methcarbomol with  Aspirin Synalgos  ASA tablets/Enseals Disalcid Micrainin Tagament  Ascriptin Doan's Midol Talwin  Ascriptin A/D Dolene Mobidin Tanderil  Ascriptin Extra Strength Dolobid Moblgesic Ticlid  Ascriptin with Codeine Doloprin or Doloprin with Codeine Momentum Tolectin  Asperbuf Duoprin Mono-gesic Trendar  Aspergum Duradyne Motrin or Motrin IB Triminicin  Aspirin plain, buffered or enteric coated Durasal Myochrisine Trigesic  Aspirin Suppositories Easprin Nalfon Trillsate  Aspirin with Codeine Ecotrin Regular or Extra Strength Naprosyn Uracel  Atromid-S Efficin Naproxen Ursinus  Auranofin Capsules Elmiron Neocylate Vanquish  Axotal Emagrin Norgesic Verin  Azathioprine Empirin or Empirin with Codeine Normiflo Vitamin E  Azolid Emprazil Nuprin Voltaren  Bayer Aspirin plain, buffered or children's or timed BC Tablets or powders Encaprin Orgaran Warfarin Sodium  Buff-a-Comp Enoxaparin Orudis Zorpin  Buff-a-Comp with Codeine Equegesic Os-Cal-Gesic   Buffaprin Excedrin plain, buffered or Extra Strength Oxalid   Bufferin Arthritis Strength Feldene Oxphenbutazone   Bufferin plain or Extra Strength Feldene Capsules Oxycodone with Aspirin   Bufferin with Codeine Fenoprofen Fenoprofen Pabalate or Pabalate-SF   Buffets II Flogesic Panagesic   Buffinol plain or Extra Strength Florinal or Florinal with Codeine Panwarfarin   Buf-Tabs Flurbiprofen Penicillamine   Butalbital Compound Four-way cold tablets Penicillin   Butazolidin Fragmin Pepto-Bismol   Carbenicillin Geminisyn Percodan   Carna Arthritis Reliever Geopen Persantine   Carprofen Gold's salt Persistin   Chloramphenicol Goody's Phenylbutazone   Chloromycetin Haltrain Piroxlcam   Clmetidine heparin Plaquenil   Cllnoril Hyco-pap Ponstel   Clofibrate Hydroxy chloroquine Propoxyphen         Before stopping any of these medications, be sure to consult the physician who ordered them.  Some, such as Coumadin (Warfarin) are ordered to prevent or  treat serious conditions such as "deep thrombosis", "pumonary embolisms", and other heart problems.  The amount of time that you may need off of the medication may also vary with the medication and the reason for which you were taking it.  If you are taking any of these medications, please make sure you notify your pain physician before you undergo any procedures.

## 2016-03-16 ENCOUNTER — Ambulatory Visit: Payer: PPO | Attending: Pain Medicine | Admitting: Pain Medicine

## 2016-03-16 ENCOUNTER — Encounter: Payer: Self-pay | Admitting: Pain Medicine

## 2016-03-16 VITALS — BP 119/83 | HR 51 | Temp 97.7°F | Resp 18 | Ht 68.0 in | Wt 160.0 lb

## 2016-03-16 DIAGNOSIS — M5030 Other cervical disc degeneration, unspecified cervical region: Secondary | ICD-10-CM

## 2016-03-16 DIAGNOSIS — M47812 Spondylosis without myelopathy or radiculopathy, cervical region: Secondary | ICD-10-CM

## 2016-03-16 DIAGNOSIS — M5136 Other intervertebral disc degeneration, lumbar region: Secondary | ICD-10-CM | POA: Insufficient documentation

## 2016-03-16 DIAGNOSIS — M545 Low back pain: Secondary | ICD-10-CM | POA: Diagnosis not present

## 2016-03-16 DIAGNOSIS — M79606 Pain in leg, unspecified: Secondary | ICD-10-CM | POA: Diagnosis not present

## 2016-03-16 DIAGNOSIS — M47817 Spondylosis without myelopathy or radiculopathy, lumbosacral region: Secondary | ICD-10-CM | POA: Diagnosis not present

## 2016-03-16 DIAGNOSIS — Z9889 Other specified postprocedural states: Secondary | ICD-10-CM | POA: Insufficient documentation

## 2016-03-16 DIAGNOSIS — Z981 Arthrodesis status: Secondary | ICD-10-CM

## 2016-03-16 DIAGNOSIS — G039 Meningitis, unspecified: Secondary | ICD-10-CM

## 2016-03-16 DIAGNOSIS — M5481 Occipital neuralgia: Secondary | ICD-10-CM

## 2016-03-16 DIAGNOSIS — M533 Sacrococcygeal disorders, not elsewhere classified: Secondary | ICD-10-CM

## 2016-03-16 DIAGNOSIS — M47816 Spondylosis without myelopathy or radiculopathy, lumbar region: Secondary | ICD-10-CM

## 2016-03-16 MED ORDER — CEFAZOLIN IN D5W 1 GM/50ML IV SOLN: 1.0000 g | Freq: Once | INTRAVENOUS | Status: DC

## 2016-03-16 MED ORDER — ORPHENADRINE CITRATE 30 MG/ML IJ SOLN: 60.0000 mg | Freq: Once | INTRAMUSCULAR | Status: DC

## 2016-03-16 MED ORDER — TRIAMCINOLONE ACETONIDE 40 MG/ML IJ SUSP
INTRAMUSCULAR | Status: AC
  Administered 2016-03-16: 11:00:00
  Filled 2016-03-16: qty 1

## 2016-03-16 MED ORDER — MIDAZOLAM HCL 5 MG/5ML IJ SOLN
INTRAMUSCULAR | Status: AC
  Administered 2016-03-16: 3 mg
  Filled 2016-03-16: qty 5

## 2016-03-16 MED ORDER — CEFUROXIME AXETIL 250 MG PO TABS: 250.0000 mg | ORAL_TABLET | Freq: Two times a day (BID) | ORAL | 0 refills | Status: DC

## 2016-03-16 MED ORDER — FENTANYL CITRATE (PF) 100 MCG/2ML IJ SOLN
INTRAMUSCULAR | Status: AC
  Administered 2016-03-16: 100 ug
  Filled 2016-03-16: qty 2

## 2016-03-16 MED ORDER — BUPIVACAINE HCL (PF) 0.25 % IJ SOLN
INTRAMUSCULAR | Status: AC
  Administered 2016-03-16: 11:00:00
  Filled 2016-03-16: qty 30

## 2016-03-16 MED ORDER — LACTATED RINGERS IV SOLN: 1000.0000 mL | INTRAVENOUS | Status: DC

## 2016-03-16 MED ORDER — MIDAZOLAM HCL 5 MG/5ML IJ SOLN: 5.0000 mg | Freq: Once | INTRAMUSCULAR | Status: DC

## 2016-03-16 MED ORDER — TRIAMCINOLONE ACETONIDE 40 MG/ML IJ SUSP: 40.0000 mg | Freq: Once | INTRAMUSCULAR | Status: DC

## 2016-03-16 MED ORDER — FENTANYL CITRATE (PF) 100 MCG/2ML IJ SOLN: 100.0000 ug | Freq: Once | INTRAMUSCULAR | Status: DC

## 2016-03-16 MED ORDER — OXYCODONE HCL 15 MG PO TABS: ORAL_TABLET | ORAL | 0 refills | Status: DC

## 2016-03-16 MED ORDER — BUPIVACAINE HCL (PF) 0.25 % IJ SOLN: 30.0000 mL | Freq: Once | INTRAMUSCULAR | Status: DC

## 2016-03-16 MED ORDER — LIDOCAINE HCL (PF) 1 % IJ SOLN: 10.0000 mL | Freq: Once | INTRAMUSCULAR | Status: DC

## 2016-03-16 MED ORDER — MORPHINE SULFATE ER 60 MG PO TBCR: EXTENDED_RELEASE_TABLET | ORAL | 0 refills | Status: DC

## 2016-03-16 MED ORDER — CEFAZOLIN SODIUM 1 G IJ SOLR
INTRAMUSCULAR | Status: AC
  Administered 2016-03-16: 11:00:00
  Filled 2016-03-16: qty 10

## 2016-03-16 MED ORDER — ORPHENADRINE CITRATE 30 MG/ML IJ SOLN
INTRAMUSCULAR | Status: AC
  Administered 2016-03-16: 11:00:00
  Filled 2016-03-16: qty 2
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
## 2016-03-16 NOTE — Progress Notes (Signed)
Patient here for procedure d/t lower back pain. Safety precautions to be maintained throughout the outpatient stay will include: orient to surroundings, keep bed in low position, maintain call bell within reach at all times, provide assistance with transfer out of bed and ambulation.  

## 2016-03-16 NOTE — Patient Instructions (Addendum)
PLAN  Continue present medication oxycodone and MS Contin  F/U PCP Dr. Thedore MinsSingh  or evaliation of  BP and general medical  condition  F/U surgical evaluation as discussed. Patient without desire to consider surgery at this time  F/U neurological evaluation. May consider PNCV/EMG studies and other studies pending follow-up evaluations  Patient to call [facility] should patient have concerns prior to scheduled return appointmentPain Management Discharge Instructions  General Discharge Instructions :  If you need to reach your doctor call: Monday-Friday 8:00 am - 4:00 pm at [phone] or toll free [phone].  After clinic hours [phone] to have operator reach doctor.  Bring all of your medication bottles to all your appointments in the pain clinic.  To cancel or reschedule your appointment with Pain Management please remember to call 24 hours in advance to avoid a fee.  Refer to the educational materials which you have been given on: General Risks, I had my Procedure. Discharge Instructions, Post Sedation.  Post Procedure Instructions:  The drugs you were given will stay in your system until tomorrow, so for the next 24 hours you should not drive, make any legal decisions or drink any alcoholic beverages.  You may eat anything you prefer, but it is better to start with liquids then soups and crackers, and gradually work up to solid foods.  Please notify your doctor immediately if you have any unusual bleeding, trouble breathing or pain that is not related to your normal pain.  Depending on the type of procedure that was done, some parts of your body may feel week and/or numb.  This usually clears up by tonight or the next day.  Walk with the use of an assistive device or accompanied by an adult for the 24 hours.  You may use ice on the affected area for the first 24 hours.  Put ice in a Ziploc bag and cover with a towel and place against area 15 minutes on 15  minutes off.  You may switch to heat after 24 hours.GENERAL RISKS AND COMPLICATIONS  What are the risk, side effects and possible complications? Generally speaking, most procedures are safe.  However, with any procedure there are risks, side effects, and the possibility of complications.  The risks and complications are dependent upon the sites that are lesioned, or the type of nerve block to be performed.  The closer the procedure is to the spine, the more serious the risks are.  Great care is taken when placing the radio frequency needles, block needles or lesioning probes, but sometimes complications can occur. 1. Infection: Any time there is an injection through the skin, there is a risk of infection.  This is why sterile conditions are used for these blocks.  There are four possible types of infection. 1. Localized skin infection. 2. Central Nervous System Infection-This can be in the form of Meningitis, which can be deadly. 3. Epidural Infections-This can be in the form of an epidural abscess, which can cause pressure inside of the spine, causing compression of the spinal cord with subsequent paralysis. This would require an emergency surgery to decompress, and there are no guarantees that the patient would recover from the paralysis. 4. Discitis-This is an infection of the intervertebral discs.  It occurs in about 1% of discography procedures.  It is difficult to treat and it may lead to surgery.        2. Pain: the needles have to go through skin and soft tissues, will cause soreness.  3. Damage to internal structures:  The nerves to be lesioned may be near blood vessels or    other nerves which can be potentially damaged.       4. Bleeding: Bleeding is more common if the patient is taking blood thinners such as  aspirin, Coumadin, Ticiid, Plavix, etc., or if he/she have some genetic predisposition  such as hemophilia. Bleeding into the spinal canal can cause compression of the spinal  cord  with subsequent paralysis.  This would require an emergency surgery to  decompress and there are no guarantees that the patient would recover from the  paralysis.       5. Pneumothorax:  Puncturing of a lung is a possibility, every time a needle is introduced in  the area of the chest or upper back.  Pneumothorax refers to free air around the  collapsed lung(s), inside of the thoracic cavity (chest cavity).  Another two possible  complications related to a similar event would include: Hemothorax and Chylothorax.   These are variations of the Pneumothorax, where instead of air around the collapsed  lung(s), you may have blood or chyle, respectively.       6. Spinal headaches: They may occur with any procedures in the area of the spine.       7. Persistent CSF (Cerebro-Spinal Fluid) leakage: This is a rare problem, but may occur  with prolonged intrathecal or epidural catheters either due to the formation of a fistulous  track or a dural tear.       8. Nerve damage: By working so close to the spinal cord, there is always a possibility of  nerve damage, which could be as serious as a permanent spinal cord injury with  paralysis.       9. Death:  Although rare, severe deadly allergic reactions known as "Anaphylactic  reaction" can occur to any of the medications used.      10. Worsening of the symptoms:  We can always make thing worse.  What are the chances of something like this happening? Chances of any of this occuring are extremely low.  By statistics, you have more of a chance of getting killed in a motor vehicle accident: while driving to the hospital than any of the above occurring .  Nevertheless, you should be aware that they are possibilities.  In general, it is similar to taking a shower.  Everybody knows that you can slip, hit your head and get killed.  Does that mean that you should not shower again?  Nevertheless always keep in mind that statistics do not mean anything if you happen to be on the  wrong side of them.  Even if a procedure has a 1 (one) in a 1,000,000 (million) chance of going wrong, it you happen to be that one..Also, keep in mind that by statistics, you have more of a chance of having something go wrong when taking medications.  Who should not have this procedure? If you are on a blood thinning medication (e.g. Coumadin, Plavix, see list of "Blood Thinners"), or if you have an active infection going on, you should not have the procedure.  If you are taking any blood thinners, please inform your physician.  How should I prepare for this procedure?  Do not eat or drink anything at least six hours prior to the procedure.  Bring a driver with you .  It cannot be a taxi.  Come accompanied by an adult that can drive you back, and  that is strong enough to help you if your legs get weak or numb from the local anesthetic.  Take all of your medicines the morning of the procedure with just enough water to swallow them.  If you have diabetes, make sure that you are scheduled to have your procedure done first thing in the morning, whenever possible.  If you have diabetes, take only half of your insulin dose and notify our nurse that you have done so as soon as you arrive at the clinic.  If you are diabetic, but only take blood sugar pills (oral hypoglycemic), then do not take them on the morning of your procedure.  You may take them after you have had the procedure.  Do not take aspirin or any aspirin-containing medications, at least eleven (11) days prior to the procedure.  They may prolong bleeding.  Wear loose fitting clothing that may be easy to take off and that you would not mind if it got stained with Betadine or blood.  Do not wear any jewelry or perfume  Remove any nail coloring.  It will interfere with some of our monitoring equipment.  NOTE: Remember that this is not meant to be interpreted as a complete list of all possible complications.  Unforeseen problems may  occur.  BLOOD THINNERS The following drugs contain aspirin or other products, which can cause increased bleeding during surgery and should not be taken for 2 weeks prior to and 1 week after surgery.  If you should need take something for relief of minor pain, you may take acetaminophen which is found in Tylenol,m Datril, Anacin-3 and Panadol. It is not blood thinner. The products listed below are.  Do not take any of the products listed below in addition to any listed on your instruction sheet.  A.P.C or A.P.C with Codeine Codeine Phosphate Capsules #3 Ibuprofen Ridaura  ABC compound Congesprin Imuran rimadil  Advil Cope Indocin Robaxisal  Alka-Seltzer Effervescent Pain Reliever and Antacid Coricidin or Coricidin-D  Indomethacin Rufen  Alka-Seltzer plus Cold Medicine Cosprin Ketoprofen S-A-C Tablets  Anacin Analgesic Tablets or Capsules Coumadin Korlgesic Salflex  Anacin Extra Strength Analgesic tablets or capsules CP-2 Tablets Lanoril Salicylate  Anaprox Cuprimine Capsules Levenox Salocol  Anexsia-D Dalteparin Magan Salsalate  Anodynos Darvon compound Magnesium Salicylate Sine-off  Ansaid Dasin Capsules Magsal Sodium Salicylate  Anturane Depen Capsules Marnal Soma  APF Arthritis pain formula Dewitt's Pills Measurin Stanback  Argesic Dia-Gesic Meclofenamic Sulfinpyrazone  Arthritis Bayer Timed Release Aspirin Diclofenac Meclomen Sulindac  Arthritis pain formula Anacin Dicumarol Medipren Supac  Analgesic (Safety coated) Arthralgen Diffunasal Mefanamic Suprofen  Arthritis Strength Bufferin Dihydrocodeine Mepro Compound Suprol  Arthropan liquid Dopirydamole Methcarbomol with Aspirin Synalgos  ASA tablets/Enseals Disalcid Micrainin Tagament  Ascriptin Doan's Midol Talwin  Ascriptin A/D Dolene Mobidin Tanderil  Ascriptin Extra Strength Dolobid Moblgesic Ticlid  Ascriptin with Codeine Doloprin or Doloprin with Codeine Momentum Tolectin  Asperbuf Duoprin Mono-gesic Trendar  Aspergum Duradyne  Motrin or Motrin IB Triminicin  Aspirin plain, buffered or enteric coated Durasal Myochrisine Trigesic  Aspirin Suppositories Easprin Nalfon Trillsate  Aspirin with Codeine Ecotrin Regular or Extra Strength Naprosyn Uracel  Atromid-S Efficin Naproxen Ursinus  Auranofin Capsules Elmiron Neocylate Vanquish  Axotal Emagrin Norgesic Verin  Azathioprine Empirin or Empirin with Codeine Normiflo Vitamin E  Azolid Emprazil Nuprin Voltaren  Bayer Aspirin plain, buffered or children's or timed BC Tablets or powders Encaprin Orgaran Warfarin Sodium  Buff-a-Comp Enoxaparin Orudis Zorpin  Buff-a-Comp with Codeine Equegesic Os-Cal-Gesic   Buffaprin Excedrin plain, buffered  or Extra Strength Oxalid   Bufferin Arthritis Strength Feldene Oxphenbutazone   Bufferin plain or Extra Strength Feldene Capsules Oxycodone with Aspirin   Bufferin with Codeine Fenoprofen Fenoprofen Pabalate or Pabalate-SF   Buffets II Flogesic Panagesic   Buffinol plain or Extra Strength Florinal or Florinal with Codeine Panwarfarin   Buf-Tabs Flurbiprofen Penicillamine   Butalbital Compound Four-way cold tablets Penicillin   Butazolidin Fragmin Pepto-Bismol   Carbenicillin Geminisyn Percodan   Carna Arthritis Reliever Geopen Persantine   Carprofen Gold's salt Persistin   Chloramphenicol Goody's Phenylbutazone   Chloromycetin Haltrain Piroxlcam   Clmetidine heparin Plaquenil   Cllnoril Hyco-pap Ponstel   Clofibrate Hydroxy chloroquine Propoxyphen         Before stopping any of these medications, be sure to consult the physician who ordered them.  Some, such as Coumadin (Warfarin) are ordered to prevent or treat serious conditions such as "deep thrombosis", "pumonary embolisms", and other heart problems.  The amount of time that you may need off of the medication may also vary with the medication and the reason for which you were taking it.  If you are taking any of these medications, please make sure you notify your pain  physician before you undergo any procedures.         Facet Blocks Patient Information  Description: The facets are joints in the spine between the vertebrae.  Like any joints in the body, facets can become irritated and painful.  Arthritis can also effect the facets.  By injecting steroids and local anesthetic in and around these joints, we can temporarily block the nerve supply to them.  Steroids act directly on irritated nerves and tissues to reduce selling and inflammation which often leads to decreased pain.  Facet blocks may be done anywhere along the spine from the neck to the low back depending upon the location of your pain.   After numbing the skin with local anesthetic (like Novocaine), a small needle is passed onto the facet joints under x-ray guidance.  You may experience a sensation of pressure while this is being done.  The entire block usually lasts about 15-25 minutes.   Conditions which may be treated by facet blocks:   Low back/buttock pain  Neck/shoulder pain  Certain types of headaches  Preparation for the injection:  1. Do not eat any solid food or dairy products within 8 hours of your appointment. 2. You may drink clear liquid up to 3 hours before appointment.  Clear liquids include water, black coffee, juice or soda.  No milk or cream please. 3. You may take your regular medication, including pain medications, with a sip of water before your appointment.  Diabetics should hold regular insulin (if taken separately) and take 1/2 normal NPH dose the morning of the procedure.  Carry some sugar containing items with you to your appointment. 4. A driver must accompany you and be prepared to drive you home after your procedure. 5. Bring all your current medications with you. 6. An IV may be inserted and sedation may be given at the discretion of the physician. 7. A blood pressure cuff, EKG and other monitors will often be applied during the procedure.  Some patients  may need to have extra oxygen administered for a short period. 8. You will be asked to provide medical information, including your allergies and medications, prior to the procedure.  We must know immediately if you are taking blood thinners (like Coumadin/Warfarin) or if you are allergic to IV iodine contrast (  dye).  We must know if you could possible be pregnant.  Possible side-effects:   Bleeding from needle site  Infection (rare, may require surgery)  Nerve injury (rare)  Numbness & tingling (temporary)  Difficulty urinating (rare, temporary)  Spinal headache (a headache worse with upright posture)  Light-headedness (temporary)  Pain at injection site (serveral days)  Decreased blood pressure (rare, temporary)  Weakness in arm/leg (temporary)  Pressure sensation in back/neck (temporary)   Call if you experience:   Fever/chills associated with headache or increased back/neck pain  Headache worsened by an upright position  New onset, weakness or numbness of an extremity below the injection site  Hives or difficulty breathing (go to the emergency room)  Inflammation or drainage at the injection site(s)  Severe back/neck pain greater than usual  New symptoms which are concerning to you  Please note:  Although the local anesthetic injected can often make your back or neck feel good for several hours after the injection, the pain will likely return. It takes 3-7 days for steroids to work.  You may not notice any pain relief for at least one week.  If effective, we will often do a series of 2-3 injections spaced 3-6 weeks apart to maximally decrease your pain.  After the initial series, you may be a candidate for a more permanent nerve block of the facets.  If you have any questions, please call #[phone] [facility]

## 2016-03-17 ENCOUNTER — Telehealth: Payer: Self-pay | Admitting: *Deleted

## 2016-03-17 NOTE — Telephone Encounter (Signed)
No problems post procedure. 

## 2016-03-23 ENCOUNTER — Ambulatory Visit: Payer: PPO | Attending: Pain Medicine | Admitting: Pain Medicine

## 2016-03-23 ENCOUNTER — Encounter: Payer: Self-pay | Admitting: Pain Medicine

## 2016-03-23 VITALS — BP 124/77 | HR 57 | Temp 98.1°F | Resp 18 | Ht 68.0 in | Wt 160.0 lb

## 2016-03-23 DIAGNOSIS — M4806 Spinal stenosis, lumbar region: Secondary | ICD-10-CM | POA: Insufficient documentation

## 2016-03-23 DIAGNOSIS — M5136 Other intervertebral disc degeneration, lumbar region: Secondary | ICD-10-CM | POA: Insufficient documentation

## 2016-03-23 DIAGNOSIS — M533 Sacrococcygeal disorders, not elsewhere classified: Secondary | ICD-10-CM | POA: Insufficient documentation

## 2016-03-23 DIAGNOSIS — M5481 Occipital neuralgia: Secondary | ICD-10-CM

## 2016-03-23 DIAGNOSIS — M47817 Spondylosis without myelopathy or radiculopathy, lumbosacral region: Secondary | ICD-10-CM | POA: Diagnosis not present

## 2016-03-23 DIAGNOSIS — M79606 Pain in leg, unspecified: Secondary | ICD-10-CM | POA: Diagnosis not present

## 2016-03-23 DIAGNOSIS — M47816 Spondylosis without myelopathy or radiculopathy, lumbar region: Secondary | ICD-10-CM

## 2016-03-23 DIAGNOSIS — Z981 Arthrodesis status: Secondary | ICD-10-CM

## 2016-03-23 DIAGNOSIS — Z9889 Other specified postprocedural states: Secondary | ICD-10-CM | POA: Insufficient documentation

## 2016-03-23 DIAGNOSIS — M545 Low back pain: Secondary | ICD-10-CM | POA: Diagnosis not present

## 2016-03-23 DIAGNOSIS — M791 Myalgia: Secondary | ICD-10-CM | POA: Diagnosis not present

## 2016-03-23 DIAGNOSIS — M47812 Spondylosis without myelopathy or radiculopathy, cervical region: Secondary | ICD-10-CM

## 2016-03-23 DIAGNOSIS — M5416 Radiculopathy, lumbar region: Secondary | ICD-10-CM | POA: Diagnosis not present

## 2016-03-23 DIAGNOSIS — M5030 Other cervical disc degeneration, unspecified cervical region: Secondary | ICD-10-CM

## 2016-03-23 MED ORDER — MORPHINE SULFATE ER 60 MG PO TBCR: EXTENDED_RELEASE_TABLET | ORAL | 0 refills | Status: AC

## 2016-03-23 MED ORDER — OXYCODONE HCL 15 MG PO TABS: ORAL_TABLET | ORAL | 0 refills | Status: AC
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
## 2016-03-23 NOTE — Patient Instructions (Signed)
PLAN  Continue present medication oxycodone and MS Contin  F/U PCP Dr. Thedore MinsSingh  or evaliation of  BP and general medical  condition  F/U surgical evaluation as discussed. Patient without desire to consider surgery at this time  F/U neurological evaluation. May consider PNCV/EMG studies and other studies pending follow-up evaluations  Patient to call [facility] should patient have concerns prior to scheduled return appointment

## 2016-04-07 ENCOUNTER — Telehealth: Payer: Self-pay | Admitting: *Deleted

## 2016-04-21 ENCOUNTER — Encounter: Payer: PPO | Admitting: Pain Medicine

## 2016-05-08 ENCOUNTER — Other Ambulatory Visit: Payer: Self-pay | Admitting: Pain Medicine

## 2016-05-08 DIAGNOSIS — M533 Sacrococcygeal disorders, not elsewhere classified: Secondary | ICD-10-CM | POA: Diagnosis not present

## 2016-05-08 DIAGNOSIS — M5416 Radiculopathy, lumbar region: Secondary | ICD-10-CM | POA: Diagnosis not present

## 2016-05-08 DIAGNOSIS — M47817 Spondylosis without myelopathy or radiculopathy, lumbosacral region: Secondary | ICD-10-CM | POA: Diagnosis not present

## 2016-05-08 DIAGNOSIS — M791 Myalgia: Secondary | ICD-10-CM | POA: Diagnosis not present

## 2016-06-19 ENCOUNTER — Encounter (HOSPITAL_COMMUNITY): Payer: Self-pay | Admitting: Oncology

## 2016-06-19 ENCOUNTER — Emergency Department (HOSPITAL_COMMUNITY)
Admission: EM | Admit: 2016-06-19 | Discharge: 2016-06-19 | Disposition: A | Discharge status: Home / Self Care | Payer: PPO | Attending: Physician Assistant | Admitting: Physician Assistant

## 2016-06-19 ENCOUNTER — Emergency Department (HOSPITAL_COMMUNITY): Payer: PPO

## 2016-06-19 DIAGNOSIS — Z79899 Other long term (current) drug therapy: Secondary | ICD-10-CM | POA: Diagnosis not present

## 2016-06-19 DIAGNOSIS — M109 Gout, unspecified: Secondary | ICD-10-CM | POA: Diagnosis not present

## 2016-06-19 DIAGNOSIS — M25561 Pain in right knee: Secondary | ICD-10-CM | POA: Diagnosis not present

## 2016-06-19 DIAGNOSIS — M11261 Other chondrocalcinosis, right knee: Secondary | ICD-10-CM

## 2016-06-19 MED ORDER — PREDNISONE 20 MG PO TABS: ORAL_TABLET | ORAL | 0 refills | Status: DC

## 2016-06-19 MED ORDER — HYDROMORPHONE HCL 2 MG/ML IJ SOLN
2.0000 mg | Freq: Once | INTRAMUSCULAR | Status: AC
Start: 2016-06-19 — End: 2016-06-19
  Administered 2016-06-19: 2 mg via INTRAMUSCULAR
  Filled 2016-06-19: qty 1

## 2016-06-19 MED ORDER — PREDNISONE 20 MG PO TABS
60.0000 mg | ORAL_TABLET | Freq: Once | ORAL | Status: AC
  Administered 2016-06-19: 60 mg via ORAL
  Filled 2016-06-19: qty 3
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
## 2016-06-19 NOTE — ED Notes (Signed)
Family at bedside. Mother at bedside.

## 2016-06-19 NOTE — Discharge Instructions (Signed)
Do not hesitate to return to the emergency room for any new, worsening or concerning symptoms. ° °Please obtain primary care using resource guide below. Let them know that you were seen in the emergency room and that they will need to obtain records for further outpatient management. ° ° °
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
## 2016-06-19 NOTE — ED Notes (Signed)
Ortho tech John contacted for crutches

## 2016-06-29 DIAGNOSIS — M47817 Spondylosis without myelopathy or radiculopathy, lumbosacral region: Secondary | ICD-10-CM | POA: Diagnosis not present

## 2016-06-29 DIAGNOSIS — M533 Sacrococcygeal disorders, not elsewhere classified: Secondary | ICD-10-CM | POA: Diagnosis not present

## 2016-06-29 DIAGNOSIS — M791 Myalgia: Secondary | ICD-10-CM | POA: Diagnosis not present

## 2016-06-29 DIAGNOSIS — M5416 Radiculopathy, lumbar region: Secondary | ICD-10-CM | POA: Diagnosis not present

## 2016-07-29 DIAGNOSIS — M791 Myalgia: Secondary | ICD-10-CM | POA: Diagnosis not present

## 2016-07-29 DIAGNOSIS — M5416 Radiculopathy, lumbar region: Secondary | ICD-10-CM | POA: Diagnosis not present

## 2016-07-29 DIAGNOSIS — M47817 Spondylosis without myelopathy or radiculopathy, lumbosacral region: Secondary | ICD-10-CM | POA: Diagnosis not present

## 2016-07-29 DIAGNOSIS — M533 Sacrococcygeal disorders, not elsewhere classified: Secondary | ICD-10-CM | POA: Diagnosis not present

## 2016-08-25 DIAGNOSIS — M791 Myalgia: Secondary | ICD-10-CM | POA: Diagnosis not present

## 2016-08-25 DIAGNOSIS — M533 Sacrococcygeal disorders, not elsewhere classified: Secondary | ICD-10-CM | POA: Diagnosis not present

## 2016-08-25 DIAGNOSIS — M47817 Spondylosis without myelopathy or radiculopathy, lumbosacral region: Secondary | ICD-10-CM | POA: Diagnosis not present

## 2016-08-25 DIAGNOSIS — M5416 Radiculopathy, lumbar region: Secondary | ICD-10-CM | POA: Diagnosis not present

## 2016-09-22 DIAGNOSIS — M791 Myalgia: Secondary | ICD-10-CM | POA: Diagnosis not present

## 2016-09-22 DIAGNOSIS — M47817 Spondylosis without myelopathy or radiculopathy, lumbosacral region: Secondary | ICD-10-CM | POA: Diagnosis not present

## 2016-09-22 DIAGNOSIS — M5416 Radiculopathy, lumbar region: Secondary | ICD-10-CM | POA: Diagnosis not present

## 2016-09-22 DIAGNOSIS — M533 Sacrococcygeal disorders, not elsewhere classified: Secondary | ICD-10-CM | POA: Diagnosis not present

## 2016-10-19 DIAGNOSIS — Z79891 Long term (current) use of opiate analgesic: Secondary | ICD-10-CM | POA: Diagnosis not present

## 2016-10-19 DIAGNOSIS — G894 Chronic pain syndrome: Secondary | ICD-10-CM | POA: Diagnosis not present

## 2016-10-19 DIAGNOSIS — M791 Myalgia: Secondary | ICD-10-CM | POA: Diagnosis not present

## 2016-10-19 DIAGNOSIS — M47817 Spondylosis without myelopathy or radiculopathy, lumbosacral region: Secondary | ICD-10-CM | POA: Diagnosis not present

## 2016-10-19 DIAGNOSIS — M533 Sacrococcygeal disorders, not elsewhere classified: Secondary | ICD-10-CM | POA: Diagnosis not present

## 2016-10-19 DIAGNOSIS — M5416 Radiculopathy, lumbar region: Secondary | ICD-10-CM | POA: Diagnosis not present

## 2016-11-16 DIAGNOSIS — M791 Myalgia: Secondary | ICD-10-CM | POA: Diagnosis not present

## 2016-11-16 DIAGNOSIS — M47817 Spondylosis without myelopathy or radiculopathy, lumbosacral region: Secondary | ICD-10-CM | POA: Diagnosis not present

## 2016-11-16 DIAGNOSIS — M5033 Other cervical disc degeneration, cervicothoracic region: Secondary | ICD-10-CM | POA: Diagnosis not present

## 2016-11-16 DIAGNOSIS — M5136 Other intervertebral disc degeneration, lumbar region: Secondary | ICD-10-CM | POA: Diagnosis not present

## 2016-11-16 DIAGNOSIS — M542 Cervicalgia: Secondary | ICD-10-CM | POA: Diagnosis not present

## 2016-11-16 DIAGNOSIS — M545 Low back pain: Secondary | ICD-10-CM | POA: Diagnosis not present

## 2016-11-16 DIAGNOSIS — M48061 Spinal stenosis, lumbar region without neurogenic claudication: Secondary | ICD-10-CM | POA: Diagnosis not present

## 2016-11-16 DIAGNOSIS — M5416 Radiculopathy, lumbar region: Secondary | ICD-10-CM | POA: Diagnosis not present

## 2016-11-16 DIAGNOSIS — Z79891 Long term (current) use of opiate analgesic: Secondary | ICD-10-CM | POA: Diagnosis not present

## 2016-11-16 DIAGNOSIS — M533 Sacrococcygeal disorders, not elsewhere classified: Secondary | ICD-10-CM | POA: Diagnosis not present

## 2016-11-16 DIAGNOSIS — M5137 Other intervertebral disc degeneration, lumbosacral region: Secondary | ICD-10-CM | POA: Diagnosis not present

## 2016-11-16 DIAGNOSIS — M5031 Other cervical disc degeneration,  high cervical region: Secondary | ICD-10-CM | POA: Diagnosis not present

## 2016-12-14 DIAGNOSIS — M5137 Other intervertebral disc degeneration, lumbosacral region: Secondary | ICD-10-CM | POA: Diagnosis not present

## 2016-12-14 DIAGNOSIS — M5033 Other cervical disc degeneration, cervicothoracic region: Secondary | ICD-10-CM | POA: Diagnosis not present

## 2016-12-14 DIAGNOSIS — M5416 Radiculopathy, lumbar region: Secondary | ICD-10-CM | POA: Diagnosis not present

## 2016-12-14 DIAGNOSIS — M533 Sacrococcygeal disorders, not elsewhere classified: Secondary | ICD-10-CM | POA: Diagnosis not present

## 2016-12-14 DIAGNOSIS — M545 Low back pain: Secondary | ICD-10-CM | POA: Diagnosis not present

## 2016-12-14 DIAGNOSIS — M791 Myalgia: Secondary | ICD-10-CM | POA: Diagnosis not present

## 2016-12-14 DIAGNOSIS — M47817 Spondylosis without myelopathy or radiculopathy, lumbosacral region: Secondary | ICD-10-CM | POA: Diagnosis not present

## 2016-12-14 DIAGNOSIS — M5136 Other intervertebral disc degeneration, lumbar region: Secondary | ICD-10-CM | POA: Diagnosis not present

## 2016-12-14 DIAGNOSIS — M5031 Other cervical disc degeneration,  high cervical region: Secondary | ICD-10-CM | POA: Diagnosis not present

## 2016-12-14 DIAGNOSIS — M542 Cervicalgia: Secondary | ICD-10-CM | POA: Diagnosis not present

## 2016-12-14 DIAGNOSIS — Z79891 Long term (current) use of opiate analgesic: Secondary | ICD-10-CM | POA: Diagnosis not present

## 2016-12-14 DIAGNOSIS — M48061 Spinal stenosis, lumbar region without neurogenic claudication: Secondary | ICD-10-CM | POA: Diagnosis not present

## 2017-01-11 DIAGNOSIS — M5033 Other cervical disc degeneration, cervicothoracic region: Secondary | ICD-10-CM | POA: Diagnosis not present

## 2017-01-11 DIAGNOSIS — M48061 Spinal stenosis, lumbar region without neurogenic claudication: Secondary | ICD-10-CM | POA: Diagnosis not present

## 2017-01-11 DIAGNOSIS — M545 Low back pain: Secondary | ICD-10-CM | POA: Diagnosis not present

## 2017-01-11 DIAGNOSIS — M5136 Other intervertebral disc degeneration, lumbar region: Secondary | ICD-10-CM | POA: Diagnosis not present

## 2017-01-11 DIAGNOSIS — M5416 Radiculopathy, lumbar region: Secondary | ICD-10-CM | POA: Diagnosis not present

## 2017-01-11 DIAGNOSIS — M791 Myalgia: Secondary | ICD-10-CM | POA: Diagnosis not present

## 2017-01-11 DIAGNOSIS — M542 Cervicalgia: Secondary | ICD-10-CM | POA: Diagnosis not present

## 2017-01-11 DIAGNOSIS — M47817 Spondylosis without myelopathy or radiculopathy, lumbosacral region: Secondary | ICD-10-CM | POA: Diagnosis not present

## 2017-01-11 DIAGNOSIS — Z79891 Long term (current) use of opiate analgesic: Secondary | ICD-10-CM | POA: Diagnosis not present

## 2017-01-11 DIAGNOSIS — M5031 Other cervical disc degeneration,  high cervical region: Secondary | ICD-10-CM | POA: Diagnosis not present

## 2017-01-11 DIAGNOSIS — M533 Sacrococcygeal disorders, not elsewhere classified: Secondary | ICD-10-CM | POA: Diagnosis not present

## 2017-01-11 DIAGNOSIS — M5137 Other intervertebral disc degeneration, lumbosacral region: Secondary | ICD-10-CM | POA: Diagnosis not present

## 2017-03-02 DIAGNOSIS — M791 Myalgia: Secondary | ICD-10-CM | POA: Diagnosis not present

## 2017-03-02 DIAGNOSIS — M47817 Spondylosis without myelopathy or radiculopathy, lumbosacral region: Secondary | ICD-10-CM | POA: Diagnosis not present

## 2017-03-02 DIAGNOSIS — M5416 Radiculopathy, lumbar region: Secondary | ICD-10-CM | POA: Diagnosis not present

## 2017-03-02 DIAGNOSIS — M533 Sacrococcygeal disorders, not elsewhere classified: Secondary | ICD-10-CM | POA: Diagnosis not present

## 2017-03-30 DIAGNOSIS — M5416 Radiculopathy, lumbar region: Secondary | ICD-10-CM | POA: Diagnosis not present

## 2017-03-30 DIAGNOSIS — M47817 Spondylosis without myelopathy or radiculopathy, lumbosacral region: Secondary | ICD-10-CM | POA: Diagnosis not present

## 2017-03-30 DIAGNOSIS — M791 Myalgia: Secondary | ICD-10-CM | POA: Diagnosis not present

## 2017-03-30 DIAGNOSIS — M533 Sacrococcygeal disorders, not elsewhere classified: Secondary | ICD-10-CM | POA: Diagnosis not present

## 2017-05-03 DIAGNOSIS — G894 Chronic pain syndrome: Secondary | ICD-10-CM | POA: Diagnosis not present

## 2017-05-03 DIAGNOSIS — M5136 Other intervertebral disc degeneration, lumbar region: Secondary | ICD-10-CM | POA: Diagnosis not present

## 2017-05-03 DIAGNOSIS — M546 Pain in thoracic spine: Secondary | ICD-10-CM | POA: Diagnosis not present

## 2017-05-03 DIAGNOSIS — M5416 Radiculopathy, lumbar region: Secondary | ICD-10-CM | POA: Diagnosis not present

## 2017-05-03 DIAGNOSIS — M533 Sacrococcygeal disorders, not elsewhere classified: Secondary | ICD-10-CM | POA: Diagnosis not present

## 2017-05-03 DIAGNOSIS — M47817 Spondylosis without myelopathy or radiculopathy, lumbosacral region: Secondary | ICD-10-CM | POA: Diagnosis not present

## 2017-05-03 DIAGNOSIS — M5134 Other intervertebral disc degeneration, thoracic region: Secondary | ICD-10-CM | POA: Diagnosis not present

## 2017-05-03 DIAGNOSIS — Z5181 Encounter for therapeutic drug level monitoring: Secondary | ICD-10-CM | POA: Diagnosis not present

## 2017-05-31 DIAGNOSIS — Z5181 Encounter for therapeutic drug level monitoring: Secondary | ICD-10-CM | POA: Diagnosis not present

## 2017-05-31 DIAGNOSIS — M5136 Other intervertebral disc degeneration, lumbar region: Secondary | ICD-10-CM | POA: Diagnosis not present

## 2017-05-31 DIAGNOSIS — M546 Pain in thoracic spine: Secondary | ICD-10-CM | POA: Diagnosis not present

## 2017-05-31 DIAGNOSIS — M5134 Other intervertebral disc degeneration, thoracic region: Secondary | ICD-10-CM | POA: Diagnosis not present

## 2017-06-28 DIAGNOSIS — M546 Pain in thoracic spine: Secondary | ICD-10-CM | POA: Diagnosis not present

## 2017-06-28 DIAGNOSIS — Z5181 Encounter for therapeutic drug level monitoring: Secondary | ICD-10-CM | POA: Diagnosis not present

## 2017-06-28 DIAGNOSIS — M5136 Other intervertebral disc degeneration, lumbar region: Secondary | ICD-10-CM | POA: Diagnosis not present

## 2017-06-28 DIAGNOSIS — M5134 Other intervertebral disc degeneration, thoracic region: Secondary | ICD-10-CM | POA: Diagnosis not present

## 2017-07-28 DIAGNOSIS — Z5181 Encounter for therapeutic drug level monitoring: Secondary | ICD-10-CM | POA: Diagnosis not present

## 2017-07-28 DIAGNOSIS — M5134 Other intervertebral disc degeneration, thoracic region: Secondary | ICD-10-CM | POA: Diagnosis not present

## 2017-07-28 DIAGNOSIS — M5136 Other intervertebral disc degeneration, lumbar region: Secondary | ICD-10-CM | POA: Diagnosis not present

## 2017-07-28 DIAGNOSIS — M546 Pain in thoracic spine: Secondary | ICD-10-CM | POA: Diagnosis not present

## 2017-08-17 DIAGNOSIS — M546 Pain in thoracic spine: Secondary | ICD-10-CM | POA: Diagnosis not present

## 2017-08-17 DIAGNOSIS — Z5181 Encounter for therapeutic drug level monitoring: Secondary | ICD-10-CM | POA: Diagnosis not present

## 2017-08-17 DIAGNOSIS — M5136 Other intervertebral disc degeneration, lumbar region: Secondary | ICD-10-CM | POA: Diagnosis not present

## 2017-08-17 DIAGNOSIS — M5134 Other intervertebral disc degeneration, thoracic region: Secondary | ICD-10-CM | POA: Diagnosis not present

## 2017-08-23 DIAGNOSIS — Z5181 Encounter for therapeutic drug level monitoring: Secondary | ICD-10-CM | POA: Diagnosis not present

## 2017-08-23 DIAGNOSIS — M5136 Other intervertebral disc degeneration, lumbar region: Secondary | ICD-10-CM | POA: Diagnosis not present

## 2017-08-23 DIAGNOSIS — M5134 Other intervertebral disc degeneration, thoracic region: Secondary | ICD-10-CM | POA: Diagnosis not present

## 2017-08-23 DIAGNOSIS — M546 Pain in thoracic spine: Secondary | ICD-10-CM | POA: Diagnosis not present

## 2017-09-20 DIAGNOSIS — Z5181 Encounter for therapeutic drug level monitoring: Secondary | ICD-10-CM | POA: Diagnosis not present

## 2017-09-20 DIAGNOSIS — M5134 Other intervertebral disc degeneration, thoracic region: Secondary | ICD-10-CM | POA: Diagnosis not present

## 2017-09-20 DIAGNOSIS — M5136 Other intervertebral disc degeneration, lumbar region: Secondary | ICD-10-CM | POA: Diagnosis not present

## 2017-09-20 DIAGNOSIS — M546 Pain in thoracic spine: Secondary | ICD-10-CM | POA: Diagnosis not present

## 2017-10-18 DIAGNOSIS — M5134 Other intervertebral disc degeneration, thoracic region: Secondary | ICD-10-CM | POA: Diagnosis not present

## 2017-10-18 DIAGNOSIS — M5136 Other intervertebral disc degeneration, lumbar region: Secondary | ICD-10-CM | POA: Diagnosis not present

## 2017-10-18 DIAGNOSIS — M546 Pain in thoracic spine: Secondary | ICD-10-CM | POA: Diagnosis not present

## 2017-10-18 DIAGNOSIS — Z5181 Encounter for therapeutic drug level monitoring: Secondary | ICD-10-CM | POA: Diagnosis not present

## 2017-11-10 DIAGNOSIS — M546 Pain in thoracic spine: Secondary | ICD-10-CM | POA: Diagnosis not present

## 2017-11-10 DIAGNOSIS — M5134 Other intervertebral disc degeneration, thoracic region: Secondary | ICD-10-CM | POA: Diagnosis not present

## 2017-11-10 DIAGNOSIS — Z5181 Encounter for therapeutic drug level monitoring: Secondary | ICD-10-CM | POA: Diagnosis not present

## 2017-11-10 DIAGNOSIS — M5136 Other intervertebral disc degeneration, lumbar region: Secondary | ICD-10-CM | POA: Diagnosis not present

## 2017-11-15 DIAGNOSIS — M5136 Other intervertebral disc degeneration, lumbar region: Secondary | ICD-10-CM | POA: Diagnosis not present

## 2017-11-15 DIAGNOSIS — M5134 Other intervertebral disc degeneration, thoracic region: Secondary | ICD-10-CM | POA: Diagnosis not present

## 2017-11-15 DIAGNOSIS — Z5181 Encounter for therapeutic drug level monitoring: Secondary | ICD-10-CM | POA: Diagnosis not present

## 2017-11-15 DIAGNOSIS — M546 Pain in thoracic spine: Secondary | ICD-10-CM | POA: Diagnosis not present

## 2017-12-13 DIAGNOSIS — M546 Pain in thoracic spine: Secondary | ICD-10-CM | POA: Diagnosis not present

## 2017-12-13 DIAGNOSIS — M5134 Other intervertebral disc degeneration, thoracic region: Secondary | ICD-10-CM | POA: Diagnosis not present

## 2017-12-13 DIAGNOSIS — Z5181 Encounter for therapeutic drug level monitoring: Secondary | ICD-10-CM | POA: Diagnosis not present

## 2017-12-13 DIAGNOSIS — M5136 Other intervertebral disc degeneration, lumbar region: Secondary | ICD-10-CM | POA: Diagnosis not present

## 2018-01-10 DIAGNOSIS — Z5181 Encounter for therapeutic drug level monitoring: Secondary | ICD-10-CM | POA: Diagnosis not present

## 2018-01-10 DIAGNOSIS — M5136 Other intervertebral disc degeneration, lumbar region: Secondary | ICD-10-CM | POA: Diagnosis not present

## 2018-01-10 DIAGNOSIS — M5134 Other intervertebral disc degeneration, thoracic region: Secondary | ICD-10-CM | POA: Diagnosis not present

## 2018-01-10 DIAGNOSIS — M546 Pain in thoracic spine: Secondary | ICD-10-CM | POA: Diagnosis not present

## 2018-02-07 DIAGNOSIS — M5136 Other intervertebral disc degeneration, lumbar region: Secondary | ICD-10-CM | POA: Diagnosis not present

## 2018-02-07 DIAGNOSIS — M5134 Other intervertebral disc degeneration, thoracic region: Secondary | ICD-10-CM | POA: Diagnosis not present

## 2018-02-07 DIAGNOSIS — Z5181 Encounter for therapeutic drug level monitoring: Secondary | ICD-10-CM | POA: Diagnosis not present

## 2018-02-07 DIAGNOSIS — M546 Pain in thoracic spine: Secondary | ICD-10-CM | POA: Diagnosis not present

## 2018-03-07 DIAGNOSIS — M546 Pain in thoracic spine: Secondary | ICD-10-CM | POA: Diagnosis not present

## 2018-03-07 DIAGNOSIS — Z5181 Encounter for therapeutic drug level monitoring: Secondary | ICD-10-CM | POA: Diagnosis not present

## 2018-03-07 DIAGNOSIS — M5134 Other intervertebral disc degeneration, thoracic region: Secondary | ICD-10-CM | POA: Diagnosis not present

## 2018-03-07 DIAGNOSIS — M5136 Other intervertebral disc degeneration, lumbar region: Secondary | ICD-10-CM | POA: Diagnosis not present

## 2018-04-05 DIAGNOSIS — Z5181 Encounter for therapeutic drug level monitoring: Secondary | ICD-10-CM | POA: Diagnosis not present

## 2018-04-05 DIAGNOSIS — M5136 Other intervertebral disc degeneration, lumbar region: Secondary | ICD-10-CM | POA: Diagnosis not present

## 2018-04-05 DIAGNOSIS — M546 Pain in thoracic spine: Secondary | ICD-10-CM | POA: Diagnosis not present

## 2018-04-05 DIAGNOSIS — M5134 Other intervertebral disc degeneration, thoracic region: Secondary | ICD-10-CM | POA: Diagnosis not present

## 2018-05-02 DIAGNOSIS — M5136 Other intervertebral disc degeneration, lumbar region: Secondary | ICD-10-CM | POA: Diagnosis not present

## 2018-05-02 DIAGNOSIS — M5134 Other intervertebral disc degeneration, thoracic region: Secondary | ICD-10-CM | POA: Diagnosis not present

## 2018-05-02 DIAGNOSIS — M546 Pain in thoracic spine: Secondary | ICD-10-CM | POA: Diagnosis not present

## 2018-05-02 DIAGNOSIS — Z5181 Encounter for therapeutic drug level monitoring: Secondary | ICD-10-CM | POA: Diagnosis not present

## 2018-05-30 DIAGNOSIS — M546 Pain in thoracic spine: Secondary | ICD-10-CM | POA: Diagnosis not present

## 2018-05-30 DIAGNOSIS — M5134 Other intervertebral disc degeneration, thoracic region: Secondary | ICD-10-CM | POA: Diagnosis not present

## 2018-05-30 DIAGNOSIS — M5136 Other intervertebral disc degeneration, lumbar region: Secondary | ICD-10-CM | POA: Diagnosis not present

## 2018-05-30 DIAGNOSIS — Z5181 Encounter for therapeutic drug level monitoring: Secondary | ICD-10-CM | POA: Diagnosis not present

## 2018-07-05 DIAGNOSIS — M5134 Other intervertebral disc degeneration, thoracic region: Secondary | ICD-10-CM | POA: Diagnosis not present

## 2018-07-05 DIAGNOSIS — M5136 Other intervertebral disc degeneration, lumbar region: Secondary | ICD-10-CM | POA: Diagnosis not present

## 2018-07-05 DIAGNOSIS — M546 Pain in thoracic spine: Secondary | ICD-10-CM | POA: Diagnosis not present

## 2018-07-05 DIAGNOSIS — Z5181 Encounter for therapeutic drug level monitoring: Secondary | ICD-10-CM | POA: Diagnosis not present

## 2018-08-04 DIAGNOSIS — M5136 Other intervertebral disc degeneration, lumbar region: Secondary | ICD-10-CM | POA: Diagnosis not present

## 2018-08-04 DIAGNOSIS — Z5181 Encounter for therapeutic drug level monitoring: Secondary | ICD-10-CM | POA: Diagnosis not present

## 2018-08-04 DIAGNOSIS — M5134 Other intervertebral disc degeneration, thoracic region: Secondary | ICD-10-CM | POA: Diagnosis not present

## 2018-08-04 DIAGNOSIS — M546 Pain in thoracic spine: Secondary | ICD-10-CM | POA: Diagnosis not present

## 2018-08-30 DIAGNOSIS — M546 Pain in thoracic spine: Secondary | ICD-10-CM | POA: Diagnosis not present

## 2018-08-30 DIAGNOSIS — M5134 Other intervertebral disc degeneration, thoracic region: Secondary | ICD-10-CM | POA: Diagnosis not present

## 2018-08-30 DIAGNOSIS — Z5181 Encounter for therapeutic drug level monitoring: Secondary | ICD-10-CM | POA: Diagnosis not present

## 2018-08-30 DIAGNOSIS — M5136 Other intervertebral disc degeneration, lumbar region: Secondary | ICD-10-CM | POA: Diagnosis not present

## 2018-09-26 DIAGNOSIS — M5134 Other intervertebral disc degeneration, thoracic region: Secondary | ICD-10-CM | POA: Diagnosis not present

## 2018-09-26 DIAGNOSIS — M5136 Other intervertebral disc degeneration, lumbar region: Secondary | ICD-10-CM | POA: Diagnosis not present

## 2018-09-26 DIAGNOSIS — G894 Chronic pain syndrome: Secondary | ICD-10-CM | POA: Diagnosis not present

## 2018-09-26 DIAGNOSIS — M259 Joint disorder, unspecified: Secondary | ICD-10-CM | POA: Diagnosis not present

## 2018-10-24 DIAGNOSIS — M5136 Other intervertebral disc degeneration, lumbar region: Secondary | ICD-10-CM | POA: Diagnosis not present

## 2018-10-24 DIAGNOSIS — M5134 Other intervertebral disc degeneration, thoracic region: Secondary | ICD-10-CM | POA: Diagnosis not present

## 2018-10-24 DIAGNOSIS — M546 Pain in thoracic spine: Secondary | ICD-10-CM | POA: Diagnosis not present

## 2018-10-24 DIAGNOSIS — Z5181 Encounter for therapeutic drug level monitoring: Secondary | ICD-10-CM | POA: Diagnosis not present

## 2018-11-21 DIAGNOSIS — M5136 Other intervertebral disc degeneration, lumbar region: Secondary | ICD-10-CM | POA: Diagnosis not present

## 2018-11-21 DIAGNOSIS — Z5181 Encounter for therapeutic drug level monitoring: Secondary | ICD-10-CM | POA: Diagnosis not present

## 2018-11-21 DIAGNOSIS — M5134 Other intervertebral disc degeneration, thoracic region: Secondary | ICD-10-CM | POA: Diagnosis not present

## 2018-11-21 DIAGNOSIS — M546 Pain in thoracic spine: Secondary | ICD-10-CM | POA: Diagnosis not present

## 2018-12-19 DIAGNOSIS — M546 Pain in thoracic spine: Secondary | ICD-10-CM | POA: Diagnosis not present

## 2018-12-19 DIAGNOSIS — Z5181 Encounter for therapeutic drug level monitoring: Secondary | ICD-10-CM | POA: Diagnosis not present

## 2018-12-19 DIAGNOSIS — M5134 Other intervertebral disc degeneration, thoracic region: Secondary | ICD-10-CM | POA: Diagnosis not present

## 2018-12-19 DIAGNOSIS — M5136 Other intervertebral disc degeneration, lumbar region: Secondary | ICD-10-CM | POA: Diagnosis not present

## 2019-01-16 DIAGNOSIS — M5134 Other intervertebral disc degeneration, thoracic region: Secondary | ICD-10-CM | POA: Diagnosis not present

## 2019-01-16 DIAGNOSIS — M5136 Other intervertebral disc degeneration, lumbar region: Secondary | ICD-10-CM | POA: Diagnosis not present

## 2019-01-16 DIAGNOSIS — M546 Pain in thoracic spine: Secondary | ICD-10-CM | POA: Diagnosis not present

## 2019-01-16 DIAGNOSIS — Z5181 Encounter for therapeutic drug level monitoring: Secondary | ICD-10-CM | POA: Diagnosis not present

## 2019-02-13 DIAGNOSIS — Z5181 Encounter for therapeutic drug level monitoring: Secondary | ICD-10-CM | POA: Diagnosis not present

## 2019-02-13 DIAGNOSIS — M5134 Other intervertebral disc degeneration, thoracic region: Secondary | ICD-10-CM | POA: Diagnosis not present

## 2019-02-13 DIAGNOSIS — M5136 Other intervertebral disc degeneration, lumbar region: Secondary | ICD-10-CM | POA: Diagnosis not present

## 2019-02-13 DIAGNOSIS — M546 Pain in thoracic spine: Secondary | ICD-10-CM | POA: Diagnosis not present

## 2019-03-13 DIAGNOSIS — M5134 Other intervertebral disc degeneration, thoracic region: Secondary | ICD-10-CM | POA: Diagnosis not present

## 2019-03-13 DIAGNOSIS — M25559 Pain in unspecified hip: Secondary | ICD-10-CM | POA: Diagnosis not present

## 2019-03-13 DIAGNOSIS — M5136 Other intervertebral disc degeneration, lumbar region: Secondary | ICD-10-CM | POA: Diagnosis not present

## 2019-03-13 DIAGNOSIS — M25519 Pain in unspecified shoulder: Secondary | ICD-10-CM | POA: Diagnosis not present

## 2019-03-13 DIAGNOSIS — M546 Pain in thoracic spine: Secondary | ICD-10-CM | POA: Diagnosis not present

## 2019-03-13 DIAGNOSIS — Z5181 Encounter for therapeutic drug level monitoring: Secondary | ICD-10-CM | POA: Diagnosis not present

## 2019-03-13 DIAGNOSIS — M4807 Spinal stenosis, lumbosacral region: Secondary | ICD-10-CM | POA: Diagnosis not present

## 2019-03-13 DIAGNOSIS — M48062 Spinal stenosis, lumbar region with neurogenic claudication: Secondary | ICD-10-CM | POA: Diagnosis not present

## 2019-03-13 DIAGNOSIS — M792 Neuralgia and neuritis, unspecified: Secondary | ICD-10-CM | POA: Diagnosis not present

## 2019-03-13 DIAGNOSIS — M4726 Other spondylosis with radiculopathy, lumbar region: Secondary | ICD-10-CM | POA: Diagnosis not present

## 2019-03-13 DIAGNOSIS — G8929 Other chronic pain: Secondary | ICD-10-CM | POA: Diagnosis not present

## 2019-03-13 DIAGNOSIS — M47897 Other spondylosis, lumbosacral region: Secondary | ICD-10-CM | POA: Diagnosis not present

## 2019-04-11 DIAGNOSIS — M5136 Other intervertebral disc degeneration, lumbar region: Secondary | ICD-10-CM | POA: Diagnosis not present

## 2019-04-11 DIAGNOSIS — M5134 Other intervertebral disc degeneration, thoracic region: Secondary | ICD-10-CM | POA: Diagnosis not present

## 2019-04-11 DIAGNOSIS — Z5181 Encounter for therapeutic drug level monitoring: Secondary | ICD-10-CM | POA: Diagnosis not present

## 2019-04-11 DIAGNOSIS — M546 Pain in thoracic spine: Secondary | ICD-10-CM | POA: Diagnosis not present

## 2019-05-08 DIAGNOSIS — Z5181 Encounter for therapeutic drug level monitoring: Secondary | ICD-10-CM | POA: Diagnosis not present

## 2019-05-08 DIAGNOSIS — M5134 Other intervertebral disc degeneration, thoracic region: Secondary | ICD-10-CM | POA: Diagnosis not present

## 2019-05-08 DIAGNOSIS — M5136 Other intervertebral disc degeneration, lumbar region: Secondary | ICD-10-CM | POA: Diagnosis not present

## 2019-05-08 DIAGNOSIS — M546 Pain in thoracic spine: Secondary | ICD-10-CM | POA: Diagnosis not present

## 2019-06-05 DIAGNOSIS — M545 Low back pain: Secondary | ICD-10-CM | POA: Diagnosis not present

## 2019-06-05 DIAGNOSIS — M5134 Other intervertebral disc degeneration, thoracic region: Secondary | ICD-10-CM | POA: Diagnosis not present

## 2019-06-05 DIAGNOSIS — G894 Chronic pain syndrome: Secondary | ICD-10-CM | POA: Diagnosis not present

## 2019-06-05 DIAGNOSIS — M546 Pain in thoracic spine: Secondary | ICD-10-CM | POA: Diagnosis not present

## 2019-07-03 DIAGNOSIS — M5136 Other intervertebral disc degeneration, lumbar region: Secondary | ICD-10-CM | POA: Diagnosis not present

## 2019-07-03 DIAGNOSIS — M545 Low back pain: Secondary | ICD-10-CM | POA: Diagnosis not present

## 2019-07-03 DIAGNOSIS — G894 Chronic pain syndrome: Secondary | ICD-10-CM | POA: Diagnosis not present

## 2019-07-03 DIAGNOSIS — M5134 Other intervertebral disc degeneration, thoracic region: Secondary | ICD-10-CM | POA: Diagnosis not present

## 2019-07-03 DIAGNOSIS — M546 Pain in thoracic spine: Secondary | ICD-10-CM | POA: Diagnosis not present

## 2019-07-17 ENCOUNTER — Encounter (HOSPITAL_COMMUNITY): Payer: Self-pay | Admitting: Emergency Medicine

## 2019-07-17 ENCOUNTER — Inpatient Hospital Stay (HOSPITAL_COMMUNITY)
Admission: EM | Admit: 2019-07-17 | Discharge: 2019-07-19 | DRG: 871 | Discharge status: Against Medical Advice | Payer: PPO | Attending: Internal Medicine | Admitting: Internal Medicine

## 2019-07-17 ENCOUNTER — Other Ambulatory Visit: Payer: Self-pay

## 2019-07-17 DIAGNOSIS — J1289 Other viral pneumonia: Secondary | ICD-10-CM | POA: Diagnosis present

## 2019-07-17 DIAGNOSIS — N1831 Chronic kidney disease, stage 3a: Secondary | ICD-10-CM | POA: Diagnosis present

## 2019-07-17 DIAGNOSIS — N179 Acute kidney failure, unspecified: Secondary | ICD-10-CM | POA: Diagnosis present

## 2019-07-17 DIAGNOSIS — R6520 Severe sepsis without septic shock: Secondary | ICD-10-CM | POA: Diagnosis not present

## 2019-07-17 DIAGNOSIS — J9690 Respiratory failure, unspecified, unspecified whether with hypoxia or hypercapnia: Secondary | ICD-10-CM | POA: Diagnosis present

## 2019-07-17 DIAGNOSIS — A419 Sepsis, unspecified organism: Secondary | ICD-10-CM | POA: Diagnosis not present

## 2019-07-17 DIAGNOSIS — Z8249 Family history of ischemic heart disease and other diseases of the circulatory system: Secondary | ICD-10-CM | POA: Diagnosis not present

## 2019-07-17 DIAGNOSIS — Z8661 Personal history of infections of the central nervous system: Secondary | ICD-10-CM

## 2019-07-17 DIAGNOSIS — A4189 Other specified sepsis: Secondary | ICD-10-CM | POA: Diagnosis present

## 2019-07-17 DIAGNOSIS — J9601 Acute respiratory failure with hypoxia: Secondary | ICD-10-CM

## 2019-07-17 DIAGNOSIS — Z79891 Long term (current) use of opiate analgesic: Secondary | ICD-10-CM

## 2019-07-17 DIAGNOSIS — Z114 Encounter for screening for human immunodeficiency virus [HIV]: Secondary | ICD-10-CM | POA: Diagnosis not present

## 2019-07-17 DIAGNOSIS — R918 Other nonspecific abnormal finding of lung field: Secondary | ICD-10-CM | POA: Diagnosis not present

## 2019-07-17 DIAGNOSIS — K5900 Constipation, unspecified: Secondary | ICD-10-CM | POA: Diagnosis present

## 2019-07-17 DIAGNOSIS — R1110 Vomiting, unspecified: Secondary | ICD-10-CM | POA: Diagnosis not present

## 2019-07-17 DIAGNOSIS — R000 Tachycardia, unspecified: Secondary | ICD-10-CM | POA: Diagnosis not present

## 2019-07-17 DIAGNOSIS — R279 Unspecified lack of coordination: Secondary | ICD-10-CM | POA: Diagnosis not present

## 2019-07-17 DIAGNOSIS — Z720 Tobacco use: Secondary | ICD-10-CM | POA: Diagnosis not present

## 2019-07-17 DIAGNOSIS — R1084 Generalized abdominal pain: Secondary | ICD-10-CM | POA: Diagnosis not present

## 2019-07-17 DIAGNOSIS — G894 Chronic pain syndrome: Secondary | ICD-10-CM | POA: Diagnosis present

## 2019-07-17 DIAGNOSIS — Z981 Arthrodesis status: Secondary | ICD-10-CM

## 2019-07-17 DIAGNOSIS — Z20822 Contact with and (suspected) exposure to covid-19: Secondary | ICD-10-CM

## 2019-07-17 DIAGNOSIS — R109 Unspecified abdominal pain: Secondary | ICD-10-CM | POA: Diagnosis present

## 2019-07-17 DIAGNOSIS — Z79899 Other long term (current) drug therapy: Secondary | ICD-10-CM

## 2019-07-17 DIAGNOSIS — J9600 Acute respiratory failure, unspecified whether with hypoxia or hypercapnia: Secondary | ICD-10-CM | POA: Diagnosis not present

## 2019-07-17 DIAGNOSIS — R0902 Hypoxemia: Secondary | ICD-10-CM | POA: Diagnosis not present

## 2019-07-17 DIAGNOSIS — R0602 Shortness of breath: Secondary | ICD-10-CM

## 2019-07-17 DIAGNOSIS — Z743 Need for continuous supervision: Secondary | ICD-10-CM | POA: Diagnosis not present

## 2019-07-17 DIAGNOSIS — U071 COVID-19: Secondary | ICD-10-CM | POA: Diagnosis present

## 2019-07-17 LAB — URINALYSIS, ROUTINE W REFLEX MICROSCOPIC
Bilirubin Urine: NEGATIVE
Glucose, UA: NEGATIVE mg/dL
Hgb urine dipstick: NEGATIVE
Ketones, ur: NEGATIVE mg/dL
Leukocytes,Ua: NEGATIVE
Nitrite: NEGATIVE
Protein, ur: 100 mg/dL — AB
Specific Gravity, Urine: 1.032 — ABNORMAL HIGH (ref 1.005–1.030)
pH: 5 (ref 5.0–8.0)

## 2019-07-17 LAB — COMPREHENSIVE METABOLIC PANEL
ALT: 33 U/L (ref 0–44)
AST: 45 U/L — ABNORMAL HIGH (ref 15–41)
Albumin: 3.4 g/dL — ABNORMAL LOW (ref 3.5–5.0)
Alkaline Phosphatase: 37 U/L — ABNORMAL LOW (ref 38–126)
Anion gap: 11 (ref 5–15)
BUN: 19 mg/dL (ref 6–20)
CO2: 26 mmol/L (ref 22–32)
Calcium: 8.6 mg/dL — ABNORMAL LOW (ref 8.9–10.3)
Chloride: 98 mmol/L (ref 98–111)
Creatinine, Ser: 1.47 mg/dL — ABNORMAL HIGH (ref 0.61–1.24)
GFR calc Af Amer: >60 mL/min (ref >60)
GFR calc non Af Amer: 53 mL/min — ABNORMAL LOW (ref >60)
Glucose, Bld: 132 mg/dL — ABNORMAL HIGH (ref 70–99)
Potassium: 4.2 mmol/L (ref 3.5–5.1)
Sodium: 135 mmol/L (ref 135–145)
Total Bilirubin: 0.7 mg/dL (ref 0.3–1.2)
Total Protein: 7.1 g/dL (ref 6.5–8.1)

## 2019-07-17 LAB — CBC
HCT: 48.5 % (ref 39.0–52.0)
Hemoglobin: 16.1 g/dL (ref 13.0–17.0)
MCH: 30.7 pg (ref 26.0–34.0)
MCHC: 33.2 g/dL (ref 30.0–36.0)
MCV: 92.4 fL (ref 80.0–100.0)
Platelets: 208 10*3/uL (ref 150–400)
RBC: 5.25 MIL/uL (ref 4.22–5.81)
RDW: 12.4 % (ref 11.5–15.5)
WBC: 4.7 10*3/uL (ref 4.0–10.5)
nRBC: 0 % (ref 0.0–0.2)

## 2019-07-17 LAB — LIPASE, BLOOD: Lipase: 43 U/L (ref 11–51)

## 2019-07-17 MED ORDER — ACETAMINOPHEN 500 MG PO TABS
1000.0000 mg | ORAL_TABLET | Freq: Once | ORAL | Status: AC
  Administered 2019-07-17: 1000 mg via ORAL
  Filled 2019-07-17: qty 2

## 2019-07-17 MED ORDER — SODIUM CHLORIDE 0.9% FLUSH: 3.0000 mL | Freq: Once | INTRAVENOUS | Status: DC

## 2019-07-17 NOTE — ED Triage Notes (Addendum)
Pt reports 1 week of middle abd pain with decreased appetite, denies any v/d. Pt has had no BM in 3-4 days. Pt takes morphine at home for pain.

## 2019-07-18 ENCOUNTER — Emergency Department (HOSPITAL_COMMUNITY): Payer: PPO

## 2019-07-18 DIAGNOSIS — N1831 Chronic kidney disease, stage 3a: Secondary | ICD-10-CM | POA: Diagnosis present

## 2019-07-18 DIAGNOSIS — G894 Chronic pain syndrome: Secondary | ICD-10-CM | POA: Diagnosis present

## 2019-07-18 DIAGNOSIS — J1289 Other viral pneumonia: Secondary | ICD-10-CM | POA: Diagnosis present

## 2019-07-18 DIAGNOSIS — Z981 Arthrodesis status: Secondary | ICD-10-CM | POA: Diagnosis not present

## 2019-07-18 DIAGNOSIS — K5900 Constipation, unspecified: Secondary | ICD-10-CM | POA: Diagnosis present

## 2019-07-18 DIAGNOSIS — Z8249 Family history of ischemic heart disease and other diseases of the circulatory system: Secondary | ICD-10-CM | POA: Diagnosis not present

## 2019-07-18 DIAGNOSIS — Z114 Encounter for screening for human immunodeficiency virus [HIV]: Secondary | ICD-10-CM | POA: Diagnosis not present

## 2019-07-18 DIAGNOSIS — Z8661 Personal history of infections of the central nervous system: Secondary | ICD-10-CM | POA: Diagnosis not present

## 2019-07-18 DIAGNOSIS — A419 Sepsis, unspecified organism: Secondary | ICD-10-CM | POA: Diagnosis present

## 2019-07-18 DIAGNOSIS — J9690 Respiratory failure, unspecified, unspecified whether with hypoxia or hypercapnia: Secondary | ICD-10-CM | POA: Diagnosis present

## 2019-07-18 DIAGNOSIS — U071 COVID-19: Secondary | ICD-10-CM | POA: Diagnosis present

## 2019-07-18 DIAGNOSIS — R1084 Generalized abdominal pain: Secondary | ICD-10-CM | POA: Diagnosis not present

## 2019-07-18 DIAGNOSIS — J9601 Acute respiratory failure with hypoxia: Secondary | ICD-10-CM

## 2019-07-18 DIAGNOSIS — A4189 Other specified sepsis: Secondary | ICD-10-CM | POA: Diagnosis present

## 2019-07-18 DIAGNOSIS — Z720 Tobacco use: Secondary | ICD-10-CM | POA: Diagnosis not present

## 2019-07-18 DIAGNOSIS — N179 Acute kidney failure, unspecified: Secondary | ICD-10-CM | POA: Diagnosis present

## 2019-07-18 DIAGNOSIS — R109 Unspecified abdominal pain: Secondary | ICD-10-CM | POA: Diagnosis present

## 2019-07-18 DIAGNOSIS — Z79899 Other long term (current) drug therapy: Secondary | ICD-10-CM | POA: Diagnosis not present

## 2019-07-18 DIAGNOSIS — Z79891 Long term (current) use of opiate analgesic: Secondary | ICD-10-CM | POA: Diagnosis not present

## 2019-07-18 LAB — CBC WITH DIFFERENTIAL/PLATELET
Abs Immature Granulocytes: 0.04 10*3/uL (ref 0.00–0.07)
Basophils Absolute: 0 10*3/uL (ref 0.0–0.1)
Basophils Relative: 0 %
Eosinophils Absolute: 0 10*3/uL (ref 0.0–0.5)
Eosinophils Relative: 0 %
HCT: 41.2 % (ref 39.0–52.0)
Hemoglobin: 13.3 g/dL (ref 13.0–17.0)
Immature Granulocytes: 1 %
Lymphocytes Relative: 20 %
Lymphs Abs: 1.2 10*3/uL (ref 0.7–4.0)
MCH: 30.7 pg (ref 26.0–34.0)
MCHC: 32.3 g/dL (ref 30.0–36.0)
MCV: 95.2 fL (ref 80.0–100.0)
Monocytes Absolute: 0.2 10*3/uL (ref 0.1–1.0)
Monocytes Relative: 4 %
Neutro Abs: 4.5 10*3/uL (ref 1.7–7.7)
Neutrophils Relative %: 75 %
Platelets: 200 10*3/uL (ref 150–400)
RBC: 4.33 MIL/uL (ref 4.22–5.81)
RDW: 12.7 % (ref 11.5–15.5)
WBC: 5.9 10*3/uL (ref 4.0–10.5)
nRBC: 0 % (ref 0.0–0.2)

## 2019-07-18 LAB — PROCALCITONIN: Procalcitonin: <0.1 ng/mL

## 2019-07-18 LAB — FIBRINOGEN: Fibrinogen: 614 mg/dL — ABNORMAL HIGH (ref 210–475)

## 2019-07-18 LAB — SARS CORONAVIRUS 2 (TAT 6-24 HRS): SARS Coronavirus 2: POSITIVE — AB

## 2019-07-18 LAB — FERRITIN: Ferritin: 720 ng/mL — ABNORMAL HIGH (ref 24–336)

## 2019-07-18 LAB — HIV ANTIBODY (ROUTINE TESTING W REFLEX): HIV Screen 4th Generation wRfx: NONREACTIVE

## 2019-07-18 LAB — C-REACTIVE PROTEIN: CRP: 11.3 mg/dL — ABNORMAL HIGH (ref <1.0)

## 2019-07-18 LAB — LACTIC ACID, PLASMA: Lactic Acid, Venous: 1.2 mmol/L (ref 0.5–1.9)

## 2019-07-18 LAB — POC SARS CORONAVIRUS 2 AG -  ED: SARS Coronavirus 2 Ag: NEGATIVE

## 2019-07-18 LAB — D-DIMER, QUANTITATIVE: D-Dimer, Quant: 0.85 ug/mL-FEU — ABNORMAL HIGH (ref 0.00–0.50)

## 2019-07-18 MED ORDER — OXYCODONE HCL 5 MG PO TABS
15.0000 mg | ORAL_TABLET | Freq: Four times a day (QID) | ORAL | Status: DC | PRN
  Administered 2019-07-18: 15 mg via ORAL
  Filled 2019-07-18: qty 3

## 2019-07-18 MED ORDER — ASCORBIC ACID 500 MG PO TABS
500.0000 mg | ORAL_TABLET | Freq: Every day | ORAL | Status: DC
  Administered 2019-07-19: 500 mg via ORAL
  Filled 2019-07-18: qty 1

## 2019-07-18 MED ORDER — ONDANSETRON HCL 4 MG PO TABS
4.0000 mg | ORAL_TABLET | Freq: Four times a day (QID) | ORAL | Status: DC | PRN
  Administered 2019-07-19: 4 mg via ORAL
  Filled 2019-07-18: qty 1

## 2019-07-18 MED ORDER — DEXAMETHASONE SODIUM PHOSPHATE 10 MG/ML IJ SOLN
6.0000 mg | INTRAMUSCULAR | Status: DC
  Administered 2019-07-19: 6 mg via INTRAVENOUS
  Filled 2019-07-18: qty 1

## 2019-07-18 MED ORDER — DOCUSATE SODIUM 100 MG PO CAPS
100.0000 mg | ORAL_CAPSULE | Freq: Two times a day (BID) | ORAL | Status: DC
  Administered 2019-07-18: 100 mg via ORAL
  Filled 2019-07-18: qty 1

## 2019-07-18 MED ORDER — POLYETHYLENE GLYCOL 3350 17 G PO PACK: 17.0000 g | PACK | Freq: Every day | ORAL | Status: DC | PRN

## 2019-07-18 MED ORDER — LACTATED RINGERS IV SOLN: INTRAVENOUS | Status: DC

## 2019-07-18 MED ORDER — MORPHINE SULFATE ER 30 MG PO TBCR
60.0000 mg | EXTENDED_RELEASE_TABLET | Freq: Two times a day (BID) | ORAL | Status: DC
  Administered 2019-07-18 – 2019-07-19 (×3): 60 mg via ORAL
  Filled 2019-07-18: qty 2
  Filled 2019-07-18 (×2): qty 4

## 2019-07-18 MED ORDER — SODIUM CHLORIDE 0.9 % IV BOLUS
1000.0000 mL | Freq: Once | INTRAVENOUS | Status: AC
  Administered 2019-07-18: 12:00:00 1000 mL via INTRAVENOUS

## 2019-07-18 MED ORDER — IBUPROFEN 800 MG PO TABS
800.0000 mg | ORAL_TABLET | Freq: Once | ORAL | Status: AC
  Administered 2019-07-18: 800 mg via ORAL
  Filled 2019-07-18: qty 1

## 2019-07-18 MED ORDER — ACETAMINOPHEN 325 MG PO TABS
650.0000 mg | ORAL_TABLET | Freq: Four times a day (QID) | ORAL | Status: DC | PRN
  Administered 2019-07-19: 650 mg via ORAL
  Filled 2019-07-18: qty 2

## 2019-07-18 MED ORDER — ONDANSETRON HCL 4 MG/2ML IJ SOLN
4.0000 mg | Freq: Once | INTRAMUSCULAR | Status: AC
  Administered 2019-07-18: 09:00:00 4 mg via INTRAVENOUS
  Filled 2019-07-18: qty 2

## 2019-07-18 MED ORDER — IOHEXOL 350 MG/ML SOLN
80.0000 mL | Freq: Once | INTRAVENOUS | Status: AC | PRN
  Administered 2019-07-18: 10:00:00 80 mL via INTRAVENOUS

## 2019-07-18 MED ORDER — SODIUM CHLORIDE 0.9% FLUSH
3.0000 mL | Freq: Two times a day (BID) | INTRAVENOUS | Status: DC
  Administered 2019-07-19 (×2): 3 mL via INTRAVENOUS

## 2019-07-18 MED ORDER — ACETAMINOPHEN 650 MG RE SUPP: 650.0000 mg | Freq: Four times a day (QID) | RECTAL | Status: DC | PRN

## 2019-07-18 MED ORDER — ONDANSETRON HCL 4 MG/2ML IJ SOLN: 4.0000 mg | Freq: Four times a day (QID) | INTRAMUSCULAR | Status: DC | PRN

## 2019-07-18 MED ORDER — ZINC SULFATE 220 (50 ZN) MG PO CAPS
220.0000 mg | ORAL_CAPSULE | Freq: Every day | ORAL | Status: DC
  Administered 2019-07-19: 220 mg via ORAL
  Filled 2019-07-18: qty 1

## 2019-07-18 MED ORDER — ENOXAPARIN SODIUM 40 MG/0.4ML ~~LOC~~ SOLN
40.0000 mg | SUBCUTANEOUS | Status: DC
  Administered 2019-07-18 – 2019-07-19 (×2): 40 mg via SUBCUTANEOUS
  Filled 2019-07-18 (×2): qty 0.4

## 2019-07-18 MED ORDER — SODIUM CHLORIDE 0.9 % IV BOLUS
1000.0000 mL | Freq: Once | INTRAVENOUS | Status: AC
  Administered 2019-07-18: 09:00:00 1000 mL via INTRAVENOUS

## 2019-07-18 NOTE — ED Notes (Signed)
ED TO INPATIENT HANDOFF REPORT  ED Nurse Name and Phone #: Marilla Boddy [phone]  S Name/Age/Gender Anthony Landry 53 y.o. male Room/Bed: 014C/014C  Code Status   Code Status: Full Code  Home/SNF/Other Home Patient oriented to: self, place, time and situation Is this baseline? Yes   Triage Complete: Triage complete  Chief Complaint Acute respiratory failure with hypoxia ([place]) [J96.01] Respiratory failure ([place]) [J96.90]  Triage Note Pt reports 1 week of middle abd pain with decreased appetite, denies any v/d. Pt has had no BM in 3-4 days. Pt takes morphine at home for pain.     Allergies No Known Allergies  Level of Care/Admitting Diagnosis ED Disposition    ED Disposition Condition [facility] [[id number]]  Level of Care: Telemetry [5]  Covid Evaluation: Confirmed COVID Positive  Diagnosis: Respiratory failure [facility]) [[id number]]  Admitting Physician: Karmen Bongo [[id number]]  Attending Physician: Karmen Bongo [[id number]]  Estimated length of stay: 3 - 4 days  Certification:: I certify this patient will need inpatient services for at least 2 midnights       B Medical/Surgery History Past Medical History:  Diagnosis Date  . Arthritis   . Neuromuscular disorder [facility])    Past Surgical History:  Procedure Laterality Date  . BACK SURGERY  1985  . KNEE SURGERY Right 1997     A IV Location/Drains/Wounds Patient Lines/Drains/Airways Status   Active Line/Drains/Airways    Name:   Placement date:   Placement time:   Site:   Days:   Peripheral IV 02/18/15 Left Antecubital   02/18/15    1034    Antecubital   1611   Peripheral IV 03/11/15 Right   03/11/15    1150    --   1590   Peripheral IV 04/22/15 Right Hand   04/22/15    1115    Hand   1548   Peripheral IV 05/29/15 Right Antecubital   05/29/15    1155    Antecubital   1511   Peripheral IV 06/19/15 Left Hand   06/19/15    1253    Hand   1490   Peripheral IV 06/19/15 Antecubital    06/19/15    1310    Antecubital   1490   Peripheral IV 11/20/15 Right Hand   11/20/15    0814    Hand   1336   Peripheral IV 03/16/16 Right Antecubital   03/16/16    1020    Antecubital   1219   Peripheral IV 07/18/19 Right Antecubital   07/18/19    0857    Antecubital   less than 1          Intake/Output Last 24 hours No intake or output data in the 24 hours ending 07/18/19 2113  Labs/Imaging Results for orders placed or performed during the hospital encounter of 07/17/19 (from the past 48 hour(s))  Lipase, blood     Status: None   Collection Time: 07/17/19  5:45 PM  Result Value Ref Range   Lipase 43 11 - 51 U/L    Comment: Performed at [facility], [place] [address] [place], [place] [address]  Comprehensive metabolic panel     Status: Abnormal   Collection Time: 07/17/19  5:45 PM  Result Value Ref Range   Sodium 135 135 - 145 mmol/L   Potassium 4.2 3.5 - 5.1 mmol/L   Chloride 98 98 - 111 mmol/L   CO2 26 22 -  32 mmol/L   Glucose, Bld 132 (H) 70 - 99 mg/dL   BUN 19 6 - 20 mg/dL   Creatinine, Ser 1.47 (H) 0.61 - 1.24 mg/dL   Calcium 8.6 (L) 8.9 - 10.3 mg/dL   Total Protein 7.1 6.5 - 8.1 g/dL   Albumin 3.4 (L) 3.5 - 5.0 g/dL   AST 45 (H) 15 - 41 U/L   ALT 33 0 - 44 U/L   Alkaline Phosphatase 37 (L) 38 - 126 U/L   Total Bilirubin 0.7 0.3 - 1.2 mg/dL   GFR calc non Af Amer 53 (L) >60 mL/min   GFR calc Af Amer >60 >60 mL/min   Anion gap 11 5 - 15    Comment: Performed at [facility] [address], [place] [address]  CBC     Status: None   Collection Time: 07/17/19  5:45 PM  Result Value Ref Range   WBC 4.7 4.0 - 10.5 K/uL   RBC 5.25 4.22 - 5.81 MIL/uL   Hemoglobin 16.1 13.0 - 17.0 g/dL   HCT 48.5 39.0 - 52.0 %   MCV 92.4 80.0 - 100.0 fL   MCH 30.7 26.0 - 34.0 pg   MCHC 33.2 30.0 - 36.0 g/dL   RDW 12.4 11.5 - 15.5 %   Platelets 208 150 - 400 K/uL   nRBC 0.0 0.0 - 0.2 %    Comment: Performed at [facility], [place] [address] [place],  [place] [address]  Urinalysis, Routine w reflex microscopic     Status: Abnormal   Collection Time: 07/17/19  9:30 PM  Result Value Ref Range   Color, Urine AMBER (A) YELLOW    Comment: BIOCHEMICALS MAY BE AFFECTED BY COLOR   APPearance HAZY (A) CLEAR   Specific Gravity, Urine 1.032 (H) 1.005 - 1.030   pH 5.0 5.0 - 8.0   Glucose, UA NEGATIVE NEGATIVE mg/dL   Hgb urine dipstick NEGATIVE NEGATIVE   Bilirubin Urine NEGATIVE NEGATIVE   Ketones, ur NEGATIVE NEGATIVE mg/dL   Protein, ur 100 (A) NEGATIVE mg/dL   Nitrite NEGATIVE NEGATIVE   Leukocytes,Ua NEGATIVE NEGATIVE   RBC / HPF 0-5 0 - 5 RBC/hpf   WBC, UA 0-5 0 - 5 WBC/hpf   Bacteria, UA RARE (A) NONE SEEN   Mucus PRESENT    Hyaline Casts, UA PRESENT     Comment: Performed at [facility] [address], [place], [place] [address]  POC SARS Coronavirus 2 Ag-ED - Nasal Swab (BD Veritor Kit)     Status: None   Collection Time: 07/18/19  8:56 AM  Result Value Ref Range   SARS Coronavirus 2 Ag NEGATIVE NEGATIVE    Comment: (NOTE) SARS-CoV-2 antigen NOT DETECTED.  Negative results are presumptive.  Negative results do not preclude SARS-CoV-2 infection and should not be used as the sole basis for treatment or other patient management decisions, including infection  control decisions, particularly in the presence of clinical signs and  symptoms consistent with COVID-19, or in those who have been in contact with the virus.  Negative results must be combined with clinical observations, patient history, and epidemiological information. The expected result is Negative. Fact Sheet for Patients: PodPark.tn Fact Sheet for Healthcare Providers: GiftContent.is This test is not yet approved or cleared by the [place] FDA and  has been authorized for detection and/or diagnosis of SARS-CoV-2 by FDA under an Emergency Use Authorization (EUA).  This EUA will remain in effect (meaning  this test can be used) for the duration of  the COVID-19 de claration under Section 564(b)(1) of the Act, 21 U.S.C. section 360bbb-3(b)(1), unless the authorization is terminated or revoked sooner.   SARS CORONAVIRUS 2 (TAT 6-24 HRS) Nasopharyngeal Nasopharyngeal Swab     Status: Abnormal   Collection Time: 07/18/19 12:15 PM   Specimen: Nasopharyngeal Swab  Result Value Ref Range   SARS Coronavirus 2 POSITIVE (A) NEGATIVE    Comment: RESULT CALLED TO, READ BACK BY AND VERIFIED WITH: S Jaevon Paras,RN 2039 07/18/2019 D BRADLEY (NOTE) SARS-CoV-2 target nucleic acids are DETECTED. The SARS-CoV-2 RNA is generally detectable in upper and lower respiratory specimens during the acute phase of infection. Positive results are indicative of the presence of SARS-CoV-2 RNA. Clinical correlation with patient history and other diagnostic information is  necessary to determine patient infection status. Positive results do not rule out bacterial infection or co-infection with other viruses.  The expected result is Negative. Fact Sheet for Patients: SugarRoll.be Fact Sheet for Healthcare Providers: https://www.woods-mathews.com/ This test is not yet approved or cleared by the [place] FDA and  has been authorized for detection and/or diagnosis of SARS-CoV-2 by FDA under an Emergency Use Authorization (EUA). This EUA will remain  in effect (meaning this test can be used) for t he duration of the COVID-19 declaration under Section 564(b)(1) of the Act, 21 U.S.C. section 360bbb-3(b)(1), unless the authorization is terminated or revoked sooner. Performed at [facility], [place] [address] [place], [place] [address]   HIV Antibody (routine testing w rflx)     Status: None   Collection Time: 07/18/19  4:59 PM  Result Value Ref Range   HIV Screen 4th Generation wRfx NON REACTIVE NON REACTIVE    Comment: Performed at [facility] [address],  [place], [place] [address]  C-reactive protein     Status: Abnormal   Collection Time: 07/18/19  4:59 PM  Result Value Ref Range   CRP 11.3 (H) <1.0 mg/dL    Comment: Performed at [facility] [address] [place], [place] [address]  CBC with Differential/Platelet     Status: None   Collection Time: 07/18/19  4:59 PM  Result Value Ref Range   WBC 5.9 4.0 - 10.5 K/uL   RBC 4.33 4.22 - 5.81 MIL/uL   Hemoglobin 13.3 13.0 - 17.0 g/dL   HCT 41.2 39.0 - 52.0 %   MCV 95.2 80.0 - 100.0 fL   MCH 30.7 26.0 - 34.0 pg   MCHC 32.3 30.0 - 36.0 g/dL   RDW 12.7 11.5 - 15.5 %   Platelets 200 150 - 400 K/uL   nRBC 0.0 0.0 - 0.2 %   Neutrophils Relative % 75 %   Neutro Abs 4.5 1.7 - 7.7 K/uL   Lymphocytes Relative 20 %   Lymphs Abs 1.2 0.7 - 4.0 K/uL   Monocytes Relative 4 %   Monocytes Absolute 0.2 0.1 - 1.0 K/uL   Eosinophils Relative 0 %   Eosinophils Absolute 0.0 0.0 - 0.5 K/uL   Basophils Relative 0 %   Basophils Absolute 0.0 0.0 - 0.1 K/uL   Immature Granulocytes 1 %   Abs Immature Granulocytes 0.04 0.00 - 0.07 K/uL    Comment: Performed at [facility], [address], [place], [place] [address]  D-dimer, quantitative (not at [facility])     Status: Abnormal   Collection Time: 07/18/19  4:59 PM  Result Value Ref Range  D-Dimer, Quant 0.85 (H) 0.00 - 0.50 ug/mL-FEU    Comment: (NOTE) At the manufacturer cut-off of 0.50 ug/mL FEU, this assay has been documented to exclude PE with a sensitivity and negative predictive value of 97 to 99%.  At this time, this assay has not been approved by the FDA to exclude DVT/VTE. Results should be correlated with clinical presentation. Performed at [facility], [place] [address] [place], [place] [address]   Ferritin     Status: Abnormal   Collection Time: 07/18/19  4:59 PM  Result Value Ref Range   Ferritin 720 (H) 24 - 336 ng/mL    Comment: Performed at [facility] [address], , [place] [address]  Fibrinogen      Status: Abnormal   Collection Time: 07/18/19  4:59 PM  Result Value Ref Range   Fibrinogen 614 (H) 210 - 475 mg/dL    Comment: Performed at [facility] [address] [place], [place] [address]  Procalcitonin     Status: None   Collection Time: 07/18/19  4:59 PM  Result Value Ref Range   Procalcitonin <0.10 ng/mL    Comment:        Interpretation: PCT (Procalcitonin) <= 0.5 ng/mL: Systemic infection (sepsis) is not likely. Local bacterial infection is possible. (NOTE)       Sepsis PCT Algorithm           Lower Respiratory Tract                                      Infection PCT Algorithm    ----------------------------     ----------------------------         PCT < 0.25 ng/mL                PCT < 0.10 ng/mL         Strongly encourage             Strongly discourage   discontinuation of antibiotics    initiation of antibiotics    ----------------------------     -----------------------------       PCT 0.25 - 0.50 ng/mL            PCT 0.10 - 0.25 ng/mL               OR       >80% decrease in PCT            Discourage initiation of                                            antibiotics      Encourage discontinuation           of antibiotics    ----------------------------     -----------------------------         PCT >= 0.50 ng/mL              PCT 0.26 - 0.50 ng/mL               AND        <80% decrease in PCT             Encourage initiation of  antibiotics       Encourage continuation           of antibiotics    ----------------------------     -----------------------------        PCT >= 0.50 ng/mL                  PCT > 0.50 ng/mL               AND         increase in PCT                  Strongly encourage                                      initiation of antibiotics    Strongly encourage escalation           of antibiotics                                     -----------------------------                                            PCT <= 0.25 ng/mL                                                 OR                                        > 80% decrease in PCT                                     Discontinue / Do not initiate                                             antibiotics Performed at [facility], [address] [place], [place] [address]   Lactic acid, plasma     Status: None   Collection Time: 07/18/19  7:35 PM  Result Value Ref Range   Lactic Acid, Venous 1.2 0.5 - 1.9 mmol/L    Comment: Performed at [facility] [address] [place], [place] [address]   CT ABDOMEN PELVIS W CONTRAST  Result Date: 07/18/2019 CLINICAL DATA:  Generalized abdominal pain, nausea, vomiting and fever EXAM: CT ABDOMEN AND PELVIS WITH CONTRAST TECHNIQUE: Multidetector CT imaging of the abdomen and pelvis was performed using the standard protocol following bolus administration of intravenous contrast. CONTRAST:  44m OMNIPAQUE IOHEXOL 350 MG/ML SOLN COMPARISON:  None. FINDINGS: Lower chest: Symmetric dependent basilar opacities favored to represent atelectasis. Normal heart size. No pericardial or pleural effusion. Hepatobiliary: No focal liver abnormality is seen. No gallstones, gallbladder wall thickening, or biliary dilatation. Pancreas: Unremarkable. No pancreatic ductal dilatation or surrounding inflammatory changes. Spleen: Normal in size  without focal abnormality. Adrenals/Urinary Tract: Adrenal glands are unremarkable. Kidneys are normal, without renal calculi, focal lesion, or hydronephrosis. Bladder is unremarkable. Stomach/Bowel: Negative for bowel obstruction, significant dilatation, ileus, or free air. Mild fluid distension of the distal small bowel and colon may represent ongoing diarrhea. Appendix not visualized. No acute inflammatory process in the right lower quadrant. No area of bowel wall thickening. Negative for fluid collection, hemorrhage, hematoma, abscess or ascites. Vascular/Lymphatic: Intact  aorta. Negative for aneurysm. Mesenteric and renal vasculature appear patent. No veno-occlusive process or bulky adenopathy. Reproductive: No significant finding by CT. Other: No abdominal wall hernia or abnormality. No abdominopelvic ascites. Musculoskeletal: Degenerative and postoperative changes noted of the spine. IMPRESSION: Dependent bibasilar atelectasis. No other acute intra-abdominal or pelvic finding by CT. Appendix not visualized Degenerative and postop findings of the spine. Electronically Signed   By: Jerilynn Mages.  Shick M.D.   On: 07/18/2019 10:20   DG Chest Port 1 View  Result Date: 07/18/2019 CLINICAL DATA:  Hypoxia EXAM: PORTABLE CHEST 1 VIEW COMPARISON:  2012 FINDINGS: Low lung volumes reflecting shallow inspiration. Patchy density right upper lung density near monitor lead. Lungs are otherwise clear. No pleural effusion or pneumothorax. Heart size is likely within normal limits for portable technique. IMPRESSION: Small area of patchy right upper lung density, which may reflect atelectasis/consolidation. Electronically Signed   By: Macy Mis M.D.   On: 07/18/2019 08:24    Pending Labs Unresulted Labs (From admission, onward)    Start     Ordered   07/19/19 9371  Basic metabolic panel  Tomorrow morning,   R     07/18/19 1819   07/19/19 0500  CBC  Tomorrow morning,   R     07/18/19 1819   07/18/19 1810  Urinalysis, Routine w reflex microscopic  Once,   STAT     07/18/19 1816   07/18/19 1810  Culture, Urine  Once,   STAT     07/18/19 1816   07/18/19 1809  Culture, blood (routine x 2)  BLOOD CULTURE X 2,   R (with STAT occurrences)     07/18/19 1816   07/18/19 1808  Lactic acid, plasma  STAT Now then every 3 hours,   R (with STAT occurrences)     07/18/19 1816   07/18/19 1215  Urine rapid drug screen (hosp performed)  Add-on,   AD     07/18/19 1214          Vitals/Pain Today's Vitals   07/18/19 1900 07/18/19 1945 07/18/19 2000 07/18/19 2015  BP: (!) 135/97 119/79 122/81  126/81  Pulse: 73 77 85 84  Resp: '19 18 17 19  '$ Temp:      TempSrc:      SpO2: 96% 96% 95% 91%  PainSc:        Isolation Precautions No active isolations  Medications Medications  sodium chloride flush (NS) 0.9 % injection 3 mL (has no administration in time range)  morphine (MS CONTIN) 12 hr tablet 60 mg (60 mg Oral Given 07/18/19 1211)  oxyCODONE (Oxy IR/ROXICODONE) immediate release tablet 15 mg (15 mg Oral Given 07/18/19 0838)  enoxaparin (LOVENOX) injection 40 mg (40 mg Subcutaneous Given 07/18/19 1922)  sodium chloride flush (NS) 0.9 % injection 3 mL (has no administration in time range)  lactated ringers infusion ( Intravenous New Bag/Given 07/18/19 1919)  acetaminophen (TYLENOL) tablet 650 mg (has no administration in time range)    Or  acetaminophen (TYLENOL) suppository 650 mg (has no administration in time  range)  docusate sodium (COLACE) capsule 100 mg (has no administration in time range)  polyethylene glycol (MIRALAX / GLYCOLAX) packet 17 g (has no administration in time range)  ondansetron (ZOFRAN) tablet 4 mg (has no administration in time range)    Or  ondansetron (ZOFRAN) injection 4 mg (has no administration in time range)  acetaminophen (TYLENOL) tablet 1,000 mg (1,000 mg Oral Given 07/17/19 1738)  sodium chloride 0.9 % bolus 1,000 mL (0 mLs Intravenous Stopped 07/18/19 1152)  ondansetron (ZOFRAN) injection 4 mg (4 mg Intravenous Given 07/18/19 0858)  ibuprofen (ADVIL) tablet 800 mg (800 mg Oral Given 07/18/19 0838)  sodium chloride 0.9 % bolus 1,000 mL (0 mLs Intravenous Stopped 07/18/19 1706)  iohexol (OMNIPAQUE) 350 MG/ML injection 80 mL (80 mLs Intravenous Contrast Given 07/18/19 1008)    Mobility walks with person assist     Focused Assessments Pulmonary Assessment Handoff:  Lung sounds: Bilateral Breath Sounds: Clear O2 Device: Room Air        R Recommendations: See Admitting Provider Note  Report given to:   Additional Notes:

## 2019-07-18 NOTE — H&P (Signed)
History and Physical    Anthony Landry:[id number] DOB: [birth year] DOA: 07/17/2019  PCP: Leotis Shames, MD Consultants:  Metta Clines - pain management Patient coming from:  Home - lives with girlfriend; NOK: Mother, Mickie Kay, [phone]  Chief Complaint: Abdominal pain  HPI: Anthony Landry is a 53 y.o. male with medical history significant of chronic pain syndrome from DDD presenting with abdominal pain and constipation.  He reports about a week of diffuse abdominal pain.  Denies SOB.  Mild cough.  +subjective fever.  No known sick contacts.  He has felt nauseated and had reflux but has had difficulty vomiting.  Mild diarrhea interspersed with constipation.  He is still able to smell and taste.   ED Course:   Probable COVID, on 4L O2.  Abdominal pain, on chronic pain meds.  1 episode of diarrhea, not taking pain meds due to decreased PO.  Febrile to 103.  BD COVID test negative.  Review of Systems: As per HPI; otherwise review of systems reviewed and negative.    Past Medical History:  Diagnosis Date  . Arthritis   . Neuromuscular disorder [facility])     Past Surgical History:  Procedure Laterality Date  . BACK SURGERY  1985  . KNEE SURGERY Right 1997    Social History   Socioeconomic History  . Marital status: Divorced    Spouse name: Not on file  . Number of children: Not on file  . Years of education: Not on file  . Highest education level: Not on file  Occupational History  . Not on file  Tobacco Use  . Smoking status: Never Smoker  . Smokeless tobacco: Current User  Substance and Sexual Activity  . Alcohol use: No  . Drug use: No  . Sexual activity: Not on file  Other Topics Concern  . Not on file  Social History Narrative  . Not on file   Social Determinants of Health   Financial Resource Strain:   . Difficulty of Paying Living Expenses: Not on file  Food Insecurity:   . Worried About Programme researcher, broadcasting/film/video in the Last Year: Not on file  . Ran Out of Food  in the Last Year: Not on file  Transportation Needs:   . Lack of Transportation (Medical): Not on file  . Lack of Transportation (Non-Medical): Not on file  Physical Activity:   . Days of Exercise per Week: Not on file  . Minutes of Exercise per Session: Not on file  Stress:   . Feeling of Stress : Not on file  Social Connections:   . Frequency of Communication with Friends and Family: Not on file  . Frequency of Social Gatherings with Friends and Family: Not on file  . Attends Religious Services: Not on file  . Active Member of Clubs or Organizations: Not on file  . Attends Banker Meetings: Not on file  . Marital Status: Not on file  Intimate Partner Violence:   . Fear of Current or Ex-Partner: Not on file  . Emotionally Abused: Not on file  . Physically Abused: Not on file  . Sexually Abused: Not on file    No Known Allergies  Family History  Problem Relation Age of Onset  . Cancer Mother   . Heart disease Mother   . Cancer Father     Prior to Admission medications   Medication Sig Start Date End Date Taking? Authorizing Provider  cefUROXime (CEFTIN) 250 MG tablet Take 1 tablet (250 mg  total) by mouth 2 (two) times daily with a meal. Patient not taking: Reported on 06/19/2016 09/16/15   Ewing Schlein, MD  cefUROXime (CEFTIN) 250 MG tablet Take 1 tablet (250 mg total) by mouth 2 (two) times daily with a meal. Patient not taking: Reported on 06/19/2016 11/20/15   Ewing Schlein, MD  cefUROXime (CEFTIN) 250 MG tablet Take 1 tablet (250 mg total) by mouth 2 (two) times daily with a meal. Patient not taking: Reported on 06/19/2016 12/16/15   Ewing Schlein, MD  cefUROXime (CEFTIN) 250 MG tablet Take 1 tablet (250 mg total) by mouth 2 (two) times daily with a meal. Patient not taking: Reported on 06/19/2016 03/16/16   Ewing Schlein, MD  Ibuprofen 200 MG CAPS Take by mouth. Takes 4 capsules as needed    [provider]  morphine (MS CONTIN) 60 MG 12 hr  tablet Limit one tab by mouth every 8-12 hours if tolerated Patient taking differently: Take 60 mg by mouth every 12 (twelve) hours. Limit one tab by mouth every 8-12 hours if tolerated 03/23/16   Ewing Schlein, MD  oxyCODONE (ROXICODONE) 15 MG immediate release tablet Limit 1-4 tablets by mouth per day for breakthrough pain while taking MS Contin if tolerated 03/23/16   Ewing Schlein, MD  predniSONE (DELTASONE) 20 MG tablet 3 tabs po daily x 3 days, then 2 tabs x 3 days, then 1.5 tabs x 3 days, then 1 tab x 3 days, then 0.5 tabs x 3 days 06/19/16   Wynetta Emery, PA-C    Physical Exam: Vitals:   07/18/19 0900 07/18/19 0915 07/18/19 1206 07/18/19 1216  BP: 125/82 124/81  116/75  Pulse: 82 81  (!) 58  Resp: 16 16  15   Temp:   97.6 F (36.4 C)   TempSrc:   Oral   SpO2: 98% 97%  97%     . General:  Appears calm and comfortable and is NAD . Eyes:  PERRL, EOMI, normal lids, iris . ENT:  grossly normal hearing, lips & tongue, mmm; poor dentition . Neck:  no LAD, masses or thyromegaly . Cardiovascular:  RRR, no m/r/g. No LE edema.  Respiratory:   CTA bilaterally with no wheezes/rales/rhonchi.  Normal respiratory effort, but requiring 4L O2. . Abdomen:  soft, mildly diffusely TTP, ND, NABS . Skin:  no rash or induration seen on limited exam . Musculoskeletal:  grossly normal tone BUE/BLE, good ROM, no bony abnormality . Psychiatric:  blunted mood and affect, speech fluent and appropriate, AOx3 . Neurologic:  CN 2-12 grossly intact, moves all extremities in coordinated fashion    Radiological Exams on Admission: CT ABDOMEN PELVIS W CONTRAST  Result Date: 07/18/2019 CLINICAL DATA:  Generalized abdominal pain, nausea, vomiting and fever EXAM: CT ABDOMEN AND PELVIS WITH CONTRAST TECHNIQUE: Multidetector CT imaging of the abdomen and pelvis was performed using the standard protocol following bolus administration of intravenous contrast. CONTRAST:  39mL OMNIPAQUE IOHEXOL 350 MG/ML SOLN  COMPARISON:  None. FINDINGS: Lower chest: Symmetric dependent basilar opacities favored to represent atelectasis. Normal heart size. No pericardial or pleural effusion. Hepatobiliary: No focal liver abnormality is seen. No gallstones, gallbladder wall thickening, or biliary dilatation. Pancreas: Unremarkable. No pancreatic ductal dilatation or surrounding inflammatory changes. Spleen: Normal in size without focal abnormality. Adrenals/Urinary Tract: Adrenal glands are unremarkable. Kidneys are normal, without renal calculi, focal lesion, or hydronephrosis. Bladder is unremarkable. Stomach/Bowel: Negative for bowel obstruction, significant dilatation, ileus, or free air. Mild fluid distension of the distal small bowel and colon  may represent ongoing diarrhea. Appendix not visualized. No acute inflammatory process in the right lower quadrant. No area of bowel wall thickening. Negative for fluid collection, hemorrhage, hematoma, abscess or ascites. Vascular/Lymphatic: Intact aorta. Negative for aneurysm. Mesenteric and renal vasculature appear patent. No veno-occlusive process or bulky adenopathy. Reproductive: No significant finding by CT. Other: No abdominal wall hernia or abnormality. No abdominopelvic ascites. Musculoskeletal: Degenerative and postoperative changes noted of the spine. IMPRESSION: Dependent bibasilar atelectasis. No other acute intra-abdominal or pelvic finding by CT. Appendix not visualized Degenerative and postop findings of the spine. Electronically Signed   By: Judie PetitM.  Shick M.D.   On: 07/18/2019 10:20   DG Chest Port 1 View  Result Date: 07/18/2019 CLINICAL DATA:  Hypoxia EXAM: PORTABLE CHEST 1 VIEW COMPARISON:  2012 FINDINGS: Low lung volumes reflecting shallow inspiration. Patchy density right upper lung density near monitor lead. Lungs are otherwise clear. No pleural effusion or pneumothorax. Heart size is likely within normal limits for portable technique. IMPRESSION: Small area of patchy  right upper lung density, which may reflect atelectasis/consolidation. Electronically Signed   By: Guadlupe SpanishPraneil  Patel M.D.   On: 07/18/2019 08:24    EKG: Independently reviewed.  Sinus tachycardia with rate 112; nonspecific ST changes, possibly rate related   Labs on Admission: I have personally reviewed the available labs and imaging studies at the time of the admission.  Pertinent labs:   Glucose 132 BUN 19/Creatinine 1.47/GFR 53; 14/1.12/76 in 2013 AST 45/ALT 33 Normal CBC UA: 100 protein, rare bacteria BD COVID negative; Hologic confirmatory testing pending Ferritin 720 CRP 11.3   Assessment/Plan Principal Problem:   Acute respiratory failure with hypoxia (HCC) Active Problems:   Abdominal pain   Chronic pain syndrome   Stage 3a chronic kidney disease   Sepsis (HCC)   Sepsis with acute respiratory failure with hypoxia -SIRS criteria in this patient includes: Fever, tachycardia, hypoxia  -Patient has evidence of acute organ failure with borderline hypotension and acute respiratory failure requiring 4L [place] O2 supplementation -While awaiting blood cultures, this appears to be a preseptic condition. -Sepsis protocol initiated -Suspected source is uncertain, but clearly there is significant concern that his presentation is consistent with COVID-19 infection -While he presented with abdominal pain, CT of the abdomen was negative for acute disease -Blood and urine cultures pending -Will hold antibiotics for now pending culture results and COVID result -Will add HIV -Will trend lactate  -Will order lower respiratory tract procalcitonin level.  Antibiotics would not be indicated for PCT <0.1 and probably should not be used for < 0.25.  >0.5 indicates infection and >>0.5 indicates more serious disease.  As the procalcitonin level normalizes, it will be reasonable to consider de-escalation of antibiotic coverage. -All COVID labs/inflammatory markers are pending at this time, as is COVID  testing itself  Abdominal pain -Unremarkable CT -May be opioid-associated constipation if not related to COVID-19 infection -Will follow -Will allow clear liquid diet and advance as tolerated  Chronic pain syndrome -Patient takes chronic MS ER 30 mg BID and Oxy IR 15 mg TID prn -I have reviewed this patient in the [place] Controlled Substances Reporting System.  He is receiving medications from only one provider and appears to be taking them as prescribed. -He is not at particularly high risk of opioid misuse, diversion, or overdose.  Stage 3a CKD -Appears to have progressed from 2013, no intervening values -Will trend -He does report minimal PO intake but otherwise does not appear to be overly dry -UA is also  pending      DVT prophylaxis:  Lovenox Code Status:  Full - confirmed with patient Family Communication: None present; I attempted to call and left a voice mail for his mother Disposition Plan:  Home once clinically improved Consults called: None  Admission status: Admit - It is my clinical opinion that admission to INPATIENT is reasonable and necessary because of the expectation that this patient will require hospital care that crosses at least 2 midnights to treat this condition based on the medical complexity of the problems presented.  Given the aforementioned information, the predictability of an adverse outcome is felt to be significant.    Karmen Bongo MD [facility]   How to contact the [facility] Attending or Consulting provider [place] or covering provider during after hours [place], for this patient?  1. Check the care team in [facility] and look for a) attending/consulting TR[facility] provider listed and b) the [facility] team listed 2. Log into [email or web address] and use [place]'s universal password to access. If you do not have the password, please contact the hospital operator. 3. Locate the [facility] provider you are looking for under [facility] and page to a number that you can  be directly reached. 4. If you still have difficulty reaching the provider, please page the [facility] (Director on Call) for the Hospitalists listed on amion for assistance.   07/18/2019, 1:19 PM

## 2019-07-18 NOTE — ED Notes (Signed)
Call Mom Elwyn Lade @ [phone]

## 2019-07-18 NOTE — ED Provider Notes (Signed)
[facility] EMERGENCY DEPARTMENT Provider Note   CSN: [id number] Arrival date & time: 07/17/19  1659     History Chief Complaint  Patient presents with  . Abdominal Pain    Anthony Landry is a 53 y.o. male who presents to the emergency department chief complaint of abdominal pain.  He has a past medical history of chronic back pain, arachnoiditis, status post lumbar surgery.  He is on chronic opiate pain medication therapy.  He states that for 1 week he has had body aches, fatigue, abdominal pain around his umbilicus.  He has not made a bowel movement in the past 3 to 4 days and states that when he did he had "pure water" a couple of days ago.  He has had persistent nausea and retching without vomiting but states that he has been unable to take his regular pain medications due to his severe nausea in about 36 hours.  He has had a mild cough.  He denies shortness of breath, loss of sense of taste or smell or known exposure to coronavirus.  Upon arrival patient found to be febrile to 103.3 degrees.  Patient denies chest pain, rash, confusion, history of bowel obstruction or abdominal surgeries, melena or hematochezia.  HPI     Past Medical History:  Diagnosis Date  . Arthritis   . Neuromuscular disorder [facility])     Patient Active Problem List   Diagnosis Date Noted  . Arachnoiditis 01/31/2015  . DDD (degenerative disc disease), lumbar 01/01/2015  . Status post lumbar spinal fusion 01/01/2015  . Facet syndrome, lumbar 01/01/2015  . Sacroiliac joint dysfunction 01/01/2015  . DDD (degenerative disc disease), cervical 01/01/2015  . Cervical facet syndrome 01/01/2015  . Status post lumbar laminectomy 01/01/2015  . Bilateral occipital neuralgia 01/01/2015    Past Surgical History:  Procedure Laterality Date  . BACK SURGERY  1985  . KNEE SURGERY Right 1997       Family History  Problem Relation Age of Onset  . Cancer Mother   . Heart disease Mother   .  Cancer Father     Social History   Tobacco Use  . Smoking status: Never Smoker  . Smokeless tobacco: Current User  Substance Use Topics  . Alcohol use: No  . Drug use: No    Home Medications Prior to Admission medications   Medication Sig Start Date End Date Taking? Authorizing Provider  cefUROXime (CEFTIN) 250 MG tablet Take 1 tablet (250 mg total) by mouth 2 (two) times daily with a meal. Patient not taking: Reported on 06/19/2016 09/16/15   Mohammed Kindle, MD  cefUROXime (CEFTIN) 250 MG tablet Take 1 tablet (250 mg total) by mouth 2 (two) times daily with a meal. Patient not taking: Reported on 06/19/2016 11/20/15   Mohammed Kindle, MD  cefUROXime (CEFTIN) 250 MG tablet Take 1 tablet (250 mg total) by mouth 2 (two) times daily with a meal. Patient not taking: Reported on 06/19/2016 12/16/15   Mohammed Kindle, MD  cefUROXime (CEFTIN) 250 MG tablet Take 1 tablet (250 mg total) by mouth 2 (two) times daily with a meal. Patient not taking: Reported on 06/19/2016 03/16/16   Mohammed Kindle, MD  Ibuprofen 200 MG CAPS Take by mouth. Takes 4 capsules as needed    [provider]  morphine (MS CONTIN) 60 MG 12 hr tablet Limit one tab by mouth every 8-12 hours if tolerated Patient taking differently: Take 60 mg by mouth every 12 (twelve) hours. Limit one tab  by mouth every 8-12 hours if tolerated 03/23/16   Ewing Schlein, MD  oxyCODONE (ROXICODONE) 15 MG immediate release tablet Limit 1-4 tablets by mouth per day for breakthrough pain while taking MS Contin if tolerated 03/23/16   Ewing Schlein, MD  predniSONE (DELTASONE) 20 MG tablet 3 tabs po daily x 3 days, then 2 tabs x 3 days, then 1.5 tabs x 3 days, then 1 tab x 3 days, then 0.5 tabs x 3 days 06/19/16   Pisciotta, Joni Reining, PA-C    Allergies    Patient has no known allergies.  Review of Systems   Review of Systems Ten systems reviewed and are negative for acute change, except as noted in the HPI.   Physical Exam Updated Vital  Signs BP 136/89   Pulse 92   Temp 100.2 F (37.9 C) (Oral)   Resp 19   SpO2 94%   Physical Exam Vitals and nursing note reviewed.  Constitutional:      Appearance: He is well-developed. He is not ill-appearing.  HENT:     Head: Normocephalic and atraumatic.  Eyes:     Extraocular Movements: Extraocular movements intact.     Pupils: Pupils are equal, round, and reactive to light.  Cardiovascular:     Rate and Rhythm: Normal rate and regular rhythm.  Pulmonary:     Effort: Pulmonary effort is normal. Tachypnea present.     Breath sounds: Normal breath sounds.  Abdominal:     General: Abdomen is flat.     Palpations: There is no shifting dullness or hepatomegaly.     Tenderness: There is abdominal tenderness. There is no guarding. Negative signs include Murphy's sign.     Comments: Firm along the left upper and lower quadrant  Neurological:     Mental Status: He is alert and oriented to person, place, and time.     ED Results / Procedures / Treatments   Labs (all labs ordered are listed, but only abnormal results are displayed) Labs Reviewed  COMPREHENSIVE METABOLIC PANEL - Abnormal; Notable for the following components:      Result Value   Glucose, Bld 132 (*)    Creatinine, Ser 1.47 (*)    Calcium 8.6 (*)    Albumin 3.4 (*)    AST 45 (*)    Alkaline Phosphatase 37 (*)    GFR calc non Af Amer 53 (*)    All other components within normal limits  URINALYSIS, ROUTINE W REFLEX MICROSCOPIC - Abnormal; Notable for the following components:   Color, Urine AMBER (*)    APPearance HAZY (*)    Specific Gravity, Urine 1.032 (*)    Protein, ur 100 (*)    Bacteria, UA RARE (*)    All other components within normal limits  LIPASE, BLOOD  CBC  POC SARS CORONAVIRUS 2 AG -  ED    EKG EKG Interpretation  Date/Time:  Monday July 17 2019 17:22:22 EST Ventricular Rate:  112 PR Interval:  148 QRS Duration: 84 QT Interval:  314 QTC Calculation: 428 R Axis:   100 Text  Interpretation: Sinus tachycardia Rightward axis T wave abnormality, consider inferior ischemia Abnormal ECG Confirmed by Ross Marcus ([id number]) on 07/18/2019 6:32:21 AM   Radiology No results found.  Procedures Procedures (including critical care time)  Medications Ordered in ED Medications  sodium chloride flush (NS) 0.9 % injection 3 mL (has no administration in time range)  acetaminophen (TYLENOL) tablet 1,000 mg (has no administration in time range)  sodium chloride  0.9 % bolus 1,000 mL (has no administration in time range)    ED Course  I have reviewed the triage vital signs and the nursing notes.  Pertinent labs & imaging results that were available during my care of the patient were reviewed by me and considered in my medical decision making (see chart for details).  Clinical Course as of Jul 18 803  Tue Jul 18, 2019  0800 Patient hypoxic to 88% on RA with excellent wave form. Place on 3.5 L via nasal cannula with improvement in 02 to 95%   [AH]    Clinical Course User Index [AH] Arthor CaptainHarris, Kaye Mitro, PA-C   MDM Rules/Calculators/A&P                      53 year old male here with abdominal pain, fever.  Patient noted to be hypoxic to 88% on room air with excellent waveform.  I personally placed the patient on 3.5 L via nasal cannula with improvement in his oxygen status to 94 to 95%.  Differential diagnosis includes COVID-19, influenza, diverticulitis, bowel obstruction, bowel perforation, toxic megacolon, gastroenteritis, entero-colitis, opiate withdrawal symptoms. Patient does not have an elevated white blood cell count.  CMP shows some increase in the patient serum creatinine however her last value was 1.127 years ago.  Patient has a slight elevation in his AST likely secondary to Tylenol use today.  Blood glucose slightly elevated.  Patient's urine shows some protein, high specific gravity suggestive of dehydration.  Chest x-ray and CT abdomen pelvis are pending.  Covid  test is pending.  He has defervesced with oral anti-inflammatory medications  12:18 PM Patient with new oxygen requirement.  His point-of-care Covid test is negative however patient will be admitted to the hospitalist service as a person under investigation.  I personally reviewed the patient's CT abdomen and pelvis which shows no evidence of acute infection or bowel perforation on my interpretation.  Chest x-ray shows a small area of patchy right infiltrate on my interpretation.  EKG shows sinus tachycardia.  Patient's breathing status has improved with oxygen supplementation.  Lindaann PascalDonald J Landry was evaluated in Emergency Department on 07/18/2019 for the symptoms described in the history of present illness. He was evaluated in the context of the global COVID-19 pandemic, which necessitated consideration that the patient might be at risk for infection with the SARS-CoV-2 virus that causes COVID-19. Institutional protocols and algorithms that pertain to the evaluation of patients at risk for COVID-19 are in a state of rapid change based on information released by regulatory bodies including the CDC and federal and state organizations. These policies and algorithms were followed during the patient's care in the ED.  Final Clinical Impression(s) / ED Diagnoses Final diagnoses:  None    Rx / DC Orders ED Discharge Orders    None       Arthor CaptainHarris, Nathaneil Feagans, PA-C 07/18/19 1220    Arby BarrettePfeiffer, Marcy, MD 07/21/19 1141

## 2019-07-18 NOTE — ED Notes (Signed)
Pt transported to CT ?

## 2019-07-19 ENCOUNTER — Other Ambulatory Visit: Payer: Self-pay

## 2019-07-19 ENCOUNTER — Encounter (HOSPITAL_COMMUNITY): Payer: Self-pay | Admitting: Internal Medicine

## 2019-07-19 ENCOUNTER — Inpatient Hospital Stay (HOSPITAL_COMMUNITY): Payer: PPO

## 2019-07-19 DIAGNOSIS — A419 Sepsis, unspecified organism: Secondary | ICD-10-CM

## 2019-07-19 DIAGNOSIS — R6520 Severe sepsis without septic shock: Secondary | ICD-10-CM

## 2019-07-19 DIAGNOSIS — R1084 Generalized abdominal pain: Secondary | ICD-10-CM

## 2019-07-19 DIAGNOSIS — G894 Chronic pain syndrome: Secondary | ICD-10-CM

## 2019-07-19 LAB — COMPREHENSIVE METABOLIC PANEL
ALT: 24 U/L (ref 0–44)
AST: 29 U/L (ref 15–41)
Albumin: 3.1 g/dL — ABNORMAL LOW (ref 3.5–5.0)
Alkaline Phosphatase: 30 U/L — ABNORMAL LOW (ref 38–126)
Anion gap: 11 (ref 5–15)
BUN: 24 mg/dL — ABNORMAL HIGH (ref 6–20)
CO2: 26 mmol/L (ref 22–32)
Calcium: 8.4 mg/dL — ABNORMAL LOW (ref 8.9–10.3)
Chloride: 101 mmol/L (ref 98–111)
Creatinine, Ser: 1.25 mg/dL — ABNORMAL HIGH (ref 0.61–1.24)
GFR calc Af Amer: >60 mL/min (ref >60)
GFR calc non Af Amer: >60 mL/min (ref >60)
Glucose, Bld: 136 mg/dL — ABNORMAL HIGH (ref 70–99)
Potassium: 4.4 mmol/L (ref 3.5–5.1)
Sodium: 138 mmol/L (ref 135–145)
Total Bilirubin: 0.9 mg/dL (ref 0.3–1.2)
Total Protein: 6.7 g/dL (ref 6.5–8.1)

## 2019-07-19 LAB — GLUCOSE, CAPILLARY
Glucose-Capillary: 115 mg/dL — ABNORMAL HIGH (ref 70–99)
Glucose-Capillary: 117 mg/dL — ABNORMAL HIGH (ref 70–99)
Glucose-Capillary: 116 mg/dL — ABNORMAL HIGH (ref 70–99)
Glucose-Capillary: 149 mg/dL — ABNORMAL HIGH (ref 70–99)

## 2019-07-19 LAB — CBC WITH DIFFERENTIAL/PLATELET
Abs Immature Granulocytes: 0.04 10*3/uL (ref 0.00–0.07)
Basophils Absolute: 0 10*3/uL (ref 0.0–0.1)
Basophils Relative: 0 %
Eosinophils Absolute: 0 10*3/uL (ref 0.0–0.5)
Eosinophils Relative: 0 %
HCT: 41.3 % (ref 39.0–52.0)
Hemoglobin: 13.6 g/dL (ref 13.0–17.0)
Immature Granulocytes: 1 %
Lymphocytes Relative: 5 %
Lymphs Abs: 0.4 10*3/uL — ABNORMAL LOW (ref 0.7–4.0)
MCH: 31.3 pg (ref 26.0–34.0)
MCHC: 32.9 g/dL (ref 30.0–36.0)
MCV: 94.9 fL (ref 80.0–100.0)
Monocytes Absolute: 0.2 10*3/uL (ref 0.1–1.0)
Monocytes Relative: 2 %
Neutro Abs: 8.2 10*3/uL — ABNORMAL HIGH (ref 1.7–7.7)
Neutrophils Relative %: 92 %
Platelets: 238 10*3/uL (ref 150–400)
RBC: 4.35 MIL/uL (ref 4.22–5.81)
RDW: 12.8 % (ref 11.5–15.5)
WBC: 8.8 10*3/uL (ref 4.0–10.5)
nRBC: 0 % (ref 0.0–0.2)

## 2019-07-19 LAB — SAMPLE TO BLOOD BANK

## 2019-07-19 LAB — FERRITIN: Ferritin: 749 ng/mL — ABNORMAL HIGH (ref 24–336)

## 2019-07-19 LAB — HEMOGLOBIN A1C
Hgb A1c MFr Bld: 6.2 % — ABNORMAL HIGH (ref 4.8–5.6)
Mean Plasma Glucose: 131.24 mg/dL

## 2019-07-19 LAB — D-DIMER, QUANTITATIVE: D-Dimer, Quant: 0.96 ug/mL-FEU — ABNORMAL HIGH (ref 0.00–0.50)

## 2019-07-19 LAB — C-REACTIVE PROTEIN: CRP: 19.9 mg/dL — ABNORMAL HIGH (ref <1.0)

## 2019-07-19 MED ORDER — INSULIN ASPART 100 UNIT/ML ~~LOC~~ SOLN: 0.0000 [IU] | Freq: Three times a day (TID) | SUBCUTANEOUS | Status: DC

## 2019-07-19 MED ORDER — SODIUM CHLORIDE 0.9 % IV SOLN
200.0000 mg | Freq: Once | INTRAVENOUS | Status: AC
  Administered 2019-07-19: 200 mg via INTRAVENOUS
  Filled 2019-07-19: qty 40

## 2019-07-19 MED ORDER — SENNOSIDES-DOCUSATE SODIUM 8.6-50 MG PO TABS: 2.0000 | ORAL_TABLET | Freq: Every evening | ORAL | Status: DC | PRN

## 2019-07-19 MED ORDER — POLYETHYLENE GLYCOL 3350 17 G PO PACK
17.0000 g | PACK | Freq: Three times a day (TID) | ORAL | Status: DC
  Administered 2019-07-19 (×2): 17 g via ORAL
  Filled 2019-07-19: qty 1

## 2019-07-19 MED ORDER — METHYLPREDNISOLONE SODIUM SUCC 40 MG IJ SOLR
40.0000 mg | Freq: Two times a day (BID) | INTRAMUSCULAR | Status: DC
  Administered 2019-07-19: 40 mg via INTRAVENOUS
  Filled 2019-07-19: qty 1

## 2019-07-19 MED ORDER — BISACODYL 10 MG RE SUPP: 10.0000 mg | Freq: Every day | RECTAL | Status: DC | PRN

## 2019-07-19 MED ORDER — ALBUTEROL SULFATE HFA 108 (90 BASE) MCG/ACT IN AERS
2.0000 | INHALATION_SPRAY | RESPIRATORY_TRACT | Status: DC | PRN
  Filled 2019-07-19: qty 6.7

## 2019-07-19 MED ORDER — PANTOPRAZOLE SODIUM 40 MG PO TBEC
40.0000 mg | DELAYED_RELEASE_TABLET | Freq: Every day | ORAL | Status: DC
  Administered 2019-07-19: 40 mg via ORAL
  Filled 2019-07-19: qty 1

## 2019-07-19 MED ORDER — POLYETHYLENE GLYCOL 3350 17 G PO PACK
17.0000 g | PACK | Freq: Every day | ORAL | Status: DC
  Filled 2019-07-19: qty 1

## 2019-07-19 MED ORDER — SODIUM CHLORIDE 0.9 % IV SOLN: 100.0000 mg | INTRAVENOUS | Status: DC

## 2019-07-19 MED ORDER — SENNOSIDES-DOCUSATE SODIUM 8.6-50 MG PO TABS
2.0000 | ORAL_TABLET | Freq: Once | ORAL | Status: AC
  Administered 2019-07-19: 2 via ORAL
  Filled 2019-07-19: qty 2

## 2019-07-19 NOTE — Progress Notes (Signed)
Patient states he want to go, that he feels as though he is in prison. Explained need for oxygen and SOB when speaking on 3LNC. Charge nurse notified.

## 2019-07-19 NOTE — Progress Notes (Addendum)
PROGRESS NOTE                                                                                                                                                                                                             Patient Demographics:    Anthony Landry, is a 53 y.o. male, DOB - 07-28-1966, ZOX:[id number]  Outpatient Primary MD for the patient is Leotis ShamesSingh, Jasmine, MD   Admit date - 07/17/2019   LOS - 1  Chief Complaint  Patient presents with  . Abdominal Pain       Brief Narrative: Patient is a [age] y.o. male with PMHx of chronic pain syndrome on narcotics-presented with abdominal pain, constipation, he also had diffuse myalgias, fatigue nausea.  Upon arrival to the emergency room-he was found to be febrile and hypoxic.  Chest x-ray was positive for possible pneumonia, he is initial COVID-19 antigen was negative-patient was admitted as a PUI to the hospitalist service and started on steroids, subsequently patient's COVID-19 PCR was positive.  Patient [facility] then [facility].  See below for further details.     Subjective:    Anthony Singeronald Landry today feels slightly better-his abdominal pain has improved.  He claims he has not had a bowel movement in the past 7 days.   Assessment  & Plan :   Acute Hypoxic Resp Failure with sepsis due to Covid 19 Viral pneumonia: Sepsis physiology slowly improving-remains stable on 4 L of oxygen.  He appears very comfortable.  Continue steroids-we will start remdesivir.  Monitor closely-await morning labs.  If worsens-we will likely need Actemra and/or plasma.  Follow blood cultures.  Fever: T-max 100.2 F  O2 requirements:  SpO2: 96 % O2 Flow Rate (L/min): 4 L/min   COVID-19 Labs: Recent Labs    07/18/19 1659  DDIMER 0.85*  FERRITIN 720*  CRP 11.3*    No results found for: BNP  Recent Labs  Lab 07/18/19 1659  PROCALCITON <0.10    Lab Results    Component Value Date   SARSCOV2NAA POSITIVE (A) 07/18/2019     COVID-19 Medications: Steroids:12/15>> Remdesivir:12/16>>  Other medications: Diuretics:Euvolemic-no need for lasix Antibiotics: Insulin: Monitor CBGs closely while on steroids with SSI- A1c pending.  Prone/Incentive Spirometry: encouraged  incentive spirometry use 3-4/hour.  DVT Prophylaxis  :  Lovenox   AKI on  CKD stage IIIa: : Likely hemodynamically mediated-slowly improving.  Abdominal pain: Suspect secondary to COVID-19-and probably constipation.  CT of the abdomen was negative for acute abnormalities.  Start MiraLAX 3 times daily x3 doses-and Senokot x1.  Have asked nurse to give him a Dulcolax suppository-if he does not have a bowel movement by the end of the day.    Chronic pain syndrome/chronic back pain: Continue narcotics-will need to be placed on a bowel regimen-once he has a bowel movement.  Consults  :  None  Procedures  :  None  ABG:    Component Value Date/Time   TCO2 27 12/15/2010 2016    Vent Settings: N/A  Condition - Extremely Guarded  Family Communication  : Left a voicemail for mother.  Code Status :  Full Code  Diet :  Diet Order            Diet clear liquid Room service appropriate? Yes; Fluid consistency: Thin  Diet effective now               Disposition Plan  :  Remain hospitalized  Barriers to discharge: Hypoxia requiring O2 supplementation/complete 5 days of IV Remdesivir  Antimicorbials  :    Anti-infectives (From admission, onward)   None      Inpatient Medications  Scheduled Meds: . vitamin C  500 mg Oral Daily  . dexamethasone (DECADRON) injection  6 mg Intravenous Q24H  . docusate sodium  100 mg Oral BID  . enoxaparin (LOVENOX) injection  40 mg Subcutaneous Q24H  . morphine  60 mg Oral Q12H  . sodium chloride flush  3 mL Intravenous Once  . sodium chloride flush  3 mL Intravenous Q12H  . zinc sulfate  220 mg Oral Daily   Continuous Infusions: .  lactated ringers Stopped (07/19/19 0430)   PRN Meds:.acetaminophen **OR** acetaminophen, ondansetron **OR** ondansetron (ZOFRAN) IV, oxyCODONE, polyethylene glycol   Time Spent in minutes  35   See all Orders from today for further details   Jeoffrey Massed M.D on 07/19/2019 at 7:39 AM  To page go to [email or web address] - use universal password  [facility] -  Office  [phone]    Objective:   Vitals:   07/18/19 2359 07/19/19 0132 07/19/19 0427 07/19/19 0722  BP: 130/82  106/76 116/76  Pulse:   70 67  Resp: 18  16 (!) 9  Temp: 100.2 F (37.9 C) 99 F (37.2 C) 98.7 F (37.1 C) 98.2 F (36.8 C)  TempSrc: Oral Oral Oral Oral  SpO2: 96%  95% 96%  Weight: 79.9 kg     Height:  (1.727 m)       Wt Readings from Last 3 Encounters:  07/18/19 79.9 kg  06/19/16 72.6 kg  03/23/16 72.6 kg     Intake/Output Summary (Last 24 hours) at 07/19/2019 0739 Last data filed at 07/19/2019 0100 Gross per 24 hour  Intake 243 ml  Output --  Net 243 ml     Physical Exam Gen Exam:Alert awake-not in any distress HEENT:atraumatic, normocephalic Chest: B/L clear to auscultation anteriorly CVS:S1S2 regular Abdomen:soft non tender, non distended Extremities:no edema Neurology: Non focal Skin: no rash   Data Review:    CBC Recent Labs  Lab 07/17/19 1745 07/18/19 1659  WBC 4.7 5.9  HGB 16.1 13.3  HCT 48.5 41.2  PLT 208 200  MCV 92.4 95.2  MCH 30.7 30.7  MCHC 33.2 32.3  RDW 12.4 12.7  LYMPHSABS  --  1.2  MONOABS  --  0.2  EOSABS  --  0.0  BASOSABS  --  0.0    Chemistries  Recent Labs  Lab 07/17/19 1745  NA 135  K 4.2  CL 98  CO2 26  GLUCOSE 132*  BUN 19  CREATININE 1.47*  CALCIUM 8.6*  AST 45*  ALT 33  ALKPHOS 37*  BILITOT 0.7   ------------------------------------------------------------------------------------------------------------------ No results for input(s): CHOL, HDL, LDLCALC, TRIG, CHOLHDL, LDLDIRECT in the last 72 hours.  No results  found for: HGBA1C ------------------------------------------------------------------------------------------------------------------ No results for input(s): TSH, T4TOTAL, T3FREE, THYROIDAB in the last 72 hours.  Invalid input(s): FREET3 ------------------------------------------------------------------------------------------------------------------ Recent Labs    07/18/19 1659  FERRITIN 720*    Coagulation profile No results for input(s): INR, PROTIME in the last 168 hours.  Recent Labs    07/18/19 1659  DDIMER 0.85*    Cardiac Enzymes No results for input(s): CKMB, TROPONINI, MYOGLOBIN in the last 168 hours.  Invalid input(s): CK ------------------------------------------------------------------------------------------------------------------ No results found for: BNP  Micro Results Recent Results (from the past 240 hour(s))  SARS CORONAVIRUS 2 (TAT 6-24 HRS) Nasopharyngeal Nasopharyngeal Swab     Status: Abnormal   Collection Time: 07/18/19 12:15 PM   Specimen: Nasopharyngeal Swab  Result Value Ref Range Status   SARS Coronavirus 2 POSITIVE (A) NEGATIVE Final    Comment: RESULT CALLED TO, READ BACK BY AND VERIFIED WITH: S TOWNES,RN 2039 07/18/2019 D BRADLEY (NOTE) SARS-CoV-2 target nucleic acids are DETECTED. The SARS-CoV-2 RNA is generally detectable in upper and lower respiratory specimens during the acute phase of infection. Positive results are indicative of the presence of SARS-CoV-2 RNA. Clinical correlation with patient history and other diagnostic information is  necessary to determine patient infection status. Positive results do not rule out bacterial infection or co-infection with other viruses.  The expected result is Negative. Fact Sheet for Patients: HairSlick.no Fact Sheet for Healthcare Providers: quierodirigir.com This test is not yet approved or cleared by the [place] FDA and  has been  authorized for detection and/or diagnosis of SARS-CoV-2 by FDA under an Emergency Use Authorization (EUA). This EUA will remain  in effect (meaning this test can be used) for t he duration of the COVID-19 declaration under Section 564(b)(1) of the Act, 21 U.S.C. section 360bbb-3(b)(1), unless the authorization is terminated or revoked sooner. Performed at [facility], [address], [place], [place] [address]     Radiology Reports CT ABDOMEN PELVIS W CONTRAST  Result Date: 07/18/2019 CLINICAL DATA:  Generalized abdominal pain, nausea, vomiting and fever EXAM: CT ABDOMEN AND PELVIS WITH CONTRAST TECHNIQUE: Multidetector CT imaging of the abdomen and pelvis was performed using the standard protocol following bolus administration of intravenous contrast. CONTRAST:  47mL OMNIPAQUE IOHEXOL 350 MG/ML SOLN COMPARISON:  None. FINDINGS: Lower chest: Symmetric dependent basilar opacities favored to represent atelectasis. Normal heart size. No pericardial or pleural effusion. Hepatobiliary: No focal liver abnormality is seen. No gallstones, gallbladder wall thickening, or biliary dilatation. Pancreas: Unremarkable. No pancreatic ductal dilatation or surrounding inflammatory changes. Spleen: Normal in size without focal abnormality. Adrenals/Urinary Tract: Adrenal glands are unremarkable. Kidneys are normal, without renal calculi, focal lesion, or hydronephrosis. Bladder is unremarkable. Stomach/Bowel: Negative for bowel obstruction, significant dilatation, ileus, or free air. Mild fluid distension of the distal small bowel and colon may represent ongoing diarrhea. Appendix not visualized. No acute inflammatory process in the right lower quadrant. No area of bowel wall thickening. Negative for fluid collection, hemorrhage, hematoma, abscess or ascites. Vascular/Lymphatic: Intact aorta. Negative for aneurysm. Mesenteric  and renal vasculature appear patent. No veno-occlusive process or bulky adenopathy.  Reproductive: No significant finding by CT. Other: No abdominal wall hernia or abnormality. No abdominopelvic ascites. Musculoskeletal: Degenerative and postoperative changes noted of the spine. IMPRESSION: Dependent bibasilar atelectasis. No other acute intra-abdominal or pelvic finding by CT. Appendix not visualized Degenerative and postop findings of the spine. Electronically Signed   By: Jerilynn Mages.  Shick M.D.   On: 07/18/2019 10:20   DG Chest Port 1 View  Result Date: 07/18/2019 CLINICAL DATA:  Hypoxia EXAM: PORTABLE CHEST 1 VIEW COMPARISON:  2012 FINDINGS: Low lung volumes reflecting shallow inspiration. Patchy density right upper lung density near monitor lead. Lungs are otherwise clear. No pleural effusion or pneumothorax. Heart size is likely within normal limits for portable technique. IMPRESSION: Small area of patchy right upper lung density, which may reflect atelectasis/consolidation. Electronically Signed   By: Macy Mis M.D.   On: 07/18/2019 08:24

## 2019-07-19 NOTE — Progress Notes (Signed)
Patien walked out with charge nurse, to ride waiting.

## 2019-07-19 NOTE — Progress Notes (Signed)
Charge nurse and MD are aware that patient wants to leave AMA. This RN signed Against Medical Advice paper with the patient. He is alert and oriented and understands the paper he is signing. PIV removed. Daughter is coming to get patient, he will hit his call bell when she arrives so the charge nurse can walk him to the car. Patient is already dressed with shoes on and waiting for ride.

## 2019-07-19 NOTE — Progress Notes (Signed)
Primary RN informed this RN that patient is requesting to leave hospital AMA. This RN spoke to patient and educated him on the importance of staying until he is medically stable to go home. Patient states :"I will rather be in the grave than be here". Will inform MD on call of patient's wishes.

## 2019-07-19 NOTE — Progress Notes (Signed)
Mother updated

## 2019-07-20 NOTE — Discharge Summary (Signed)
PATIENT DETAILS Name: Anthony Landry Age: 53 y.o. Sex: male Date of Birth: 07/02/1966 MRN: [id number]. Admitting Physician: Karmen Bongo, MD [id number]  Admit Date: 07/17/2019 Discharge date: 07/20/2019  Note:patient left AMA  Recommendations for Outpatient Follow-up:  1. Signed out AGAINST MEDICAL ADVICE-has underlying COVID-19 pneumonia with respiratory failure.  Please assess symptoms at next visit. 2. Follow blood cultures until final  PRIMARY DISCHARGE DIAGNOSIS:  Principal Problem:   Acute respiratory failure with hypoxia (HCC) Active Problems:   Abdominal pain   Chronic pain syndrome   Stage 3a chronic kidney disease   Sepsis ([place])   Respiratory failure ([place])      PAST MEDICAL HISTORY: Past Medical History:  Diagnosis Date  . Arthritis   . Neuromuscular disorder (HCC)     ALLERGIES:  No Known Allergies  BRIEF HPI: Patient is a 53 y.o. male with PMHx of chronic pain syndrome on narcotics-presented with abdominal pain, constipation, he also had diffuse myalgias, fatigue nausea.  Upon arrival to the emergency room-he was found to be febrile and hypoxic.  Chest x-ray was positive for possible pneumonia, he is initial COVID-19 antigen was negative-patient was admitted as a PUI to the hospitalist service and started on steroids, subsequently patient's COVID-19 PCR was positive.  Patient was then transferred to [facility] on 12/15.  See below for further details.    CONSULTATIONS:   None  PERTINENT RADIOLOGIC STUDIES: CT ABDOMEN PELVIS W CONTRAST  Result Date: 07/18/2019 CLINICAL DATA:  Generalized abdominal pain, nausea, vomiting and fever EXAM: CT ABDOMEN AND PELVIS WITH CONTRAST TECHNIQUE: Multidetector CT imaging of the abdomen and pelvis was performed using the standard protocol following bolus administration of intravenous contrast. CONTRAST:  73mL OMNIPAQUE IOHEXOL 350 MG/ML SOLN COMPARISON:  None. FINDINGS: Lower chest: Symmetric  dependent basilar opacities favored to represent atelectasis. Normal heart size. No pericardial or pleural effusion. Hepatobiliary: No focal liver abnormality is seen. No gallstones, gallbladder wall thickening, or biliary dilatation. Pancreas: Unremarkable. No pancreatic ductal dilatation or surrounding inflammatory changes. Spleen: Normal in size without focal abnormality. Adrenals/Urinary Tract: Adrenal glands are unremarkable. Kidneys are normal, without renal calculi, focal lesion, or hydronephrosis. Bladder is unremarkable. Stomach/Bowel: Negative for bowel obstruction, significant dilatation, ileus, or free air. Mild fluid distension of the distal small bowel and colon may represent ongoing diarrhea. Appendix not visualized. No acute inflammatory process in the right lower quadrant. No area of bowel wall thickening. Negative for fluid collection, hemorrhage, hematoma, abscess or ascites. Vascular/Lymphatic: Intact aorta. Negative for aneurysm. Mesenteric and renal vasculature appear patent. No veno-occlusive process or bulky adenopathy. Reproductive: No significant finding by CT. Other: No abdominal wall hernia or abnormality. No abdominopelvic ascites. Musculoskeletal: Degenerative and postoperative changes noted of the spine. IMPRESSION: Dependent bibasilar atelectasis. No other acute intra-abdominal or pelvic finding by CT. Appendix not visualized Degenerative and postop findings of the spine. Electronically Signed   By: Jerilynn Mages.  Shick M.D.   On: 07/18/2019 10:20   DG Chest Port 1 View  Result Date: 07/18/2019 CLINICAL DATA:  Hypoxia EXAM: PORTABLE CHEST 1 VIEW COMPARISON:  2012 FINDINGS: Low lung volumes reflecting shallow inspiration. Patchy density right upper lung density near monitor lead. Lungs are otherwise clear. No pleural effusion or pneumothorax. Heart size is likely within normal limits for portable technique. IMPRESSION: Small area of patchy right upper lung density, which may reflect  atelectasis/consolidation. Electronically Signed   By: Macy Mis M.D.   On: 07/18/2019 08:24   DG Chest Port 1V today  Result Date: 07/19/2019 CLINICAL DATA:  53 year old male with a history of shortness of breath EXAM: PORTABLE CHEST 1 VIEW COMPARISON:  07/18/2019 FINDINGS: Cardiomediastinal silhouette unchanged in size and contour. Low lung volumes persist. Reticulonodular opacities of the bilateral lungs, worsening in the left mid lung and retrocardiac space. No pleural effusion or pneumothorax. Posttraumatic changes of the right clavicle. IMPRESSION: Low lung volumes persist, with worsening reticulonodular opacities at the periphery of the lungs, more on the left, compatible with multifocal pneumonia. Electronically Signed   By: Gilmer MorJaime  Wagner D.O.   On: 07/19/2019 11:14     PERTINENT LAB RESULTS: CBC: Recent Labs    07/18/19 1659 07/19/19 0833  WBC 5.9 8.8  HGB 13.3 13.6  HCT 41.2 41.3  PLT 200 238   CMET CMP     Component Value Date/Time   NA 138 07/19/2019 0833   K 4.4 07/19/2019 0833   CL 101 07/19/2019 0833   CO2 26 07/19/2019 0833   GLUCOSE 136 (H) 07/19/2019 0833   BUN 24 (H) 07/19/2019 0833   CREATININE 1.25 (H) 07/19/2019 0833   CALCIUM 8.4 (L) 07/19/2019 0833   PROT 6.7 07/19/2019 0833   ALBUMIN 3.1 (L) 07/19/2019 0833   AST 29 07/19/2019 0833   ALT 24 07/19/2019 0833   ALKPHOS 30 (L) 07/19/2019 0833   BILITOT 0.9 07/19/2019 0833   GFRNONAA >60 07/19/2019 0833   GFRAA >60 07/19/2019 0833    GFR Estimated Creatinine Clearance: 64.6 mL/min (A) (by C-G formula based on SCr of 1.25 mg/dL (H)). Recent Labs    07/17/19 1745  LIPASE 43   No results for input(s): CKTOTAL, CKMB, CKMBINDEX, TROPONINI in the last 72 hours. Invalid input(s): POCBNP Recent Labs    07/18/19 1659 07/19/19 0833  DDIMER 0.85* 0.96*   Recent Labs    07/19/19 0833  HGBA1C 6.2*   No results for input(s): CHOL, HDL, LDLCALC, TRIG, CHOLHDL, LDLDIRECT in the last 72 hours. No  results for input(s): TSH, T4TOTAL, T3FREE, THYROIDAB in the last 72 hours.  Invalid input(s): FREET3 Recent Labs    07/18/19 1659 07/19/19 0833  FERRITIN 720* 749*   Coags: No results for input(s): INR in the last 72 hours.  Invalid input(s): PT Microbiology: Recent Results (from the past 240 hour(s))  SARS CORONAVIRUS 2 (TAT 6-24 HRS) Nasopharyngeal Nasopharyngeal Swab     Status: Abnormal   Collection Time: 07/18/19 12:15 PM   Specimen: Nasopharyngeal Swab  Result Value Ref Range Status   SARS Coronavirus 2 POSITIVE (A) NEGATIVE Final    Comment: RESULT CALLED TO, READ BACK BY AND VERIFIED WITH: S TOWNES,RN 2039 07/18/2019 D BRADLEY (NOTE) SARS-CoV-2 target nucleic acids are DETECTED. The SARS-CoV-2 RNA is generally detectable in upper and lower respiratory specimens during the acute phase of infection. Positive results are indicative of the presence of SARS-CoV-2 RNA. Clinical correlation with patient history and other diagnostic information is  necessary to determine patient infection status. Positive results do not rule out bacterial infection or co-infection with other viruses.  The expected result is Negative. Fact Sheet for Patients: HairSlick.nohttps://www.fda.gov/media/138098/download Fact Sheet for Healthcare Providers: quierodirigir.comhttps://www.fda.gov/media/138095/download This test is not yet approved or cleared by the [place] FDA and  has been authorized for detection and/or diagnosis of SARS-CoV-2 by FDA under an Emergency Use Authorization (EUA). This EUA will remain  in effect (meaning this test can be used) for t he duration of the COVID-19 declaration under Section 564(b)(1) of the Act, 21 U.S.C. section 360bbb-3(b)(1), unless the authorization  is terminated or revoked sooner. Performed at [facility], [address], [place], [place] [address]   Culture, blood (routine x 2)     Status: None (Preliminary result)   Collection Time: 07/18/19  8:11 PM   Specimen:  BLOOD  Result Value Ref Range Status   Specimen Description BLOOD LEFT ANTECUBITAL  Final   Special Requests   Final    BOTTLES DRAWN AEROBIC AND ANAEROBIC Blood Culture adequate volume   Culture   Final    NO GROWTH 2 DAYS Performed at [facility], [address], [place], [place] [address]    Report Status PENDING  Incomplete     BRIEF HOSPITAL COURSE:  Acute Hypoxic Resp Failure with sepsis due to Covid 19 Viral pneumonia:  Was slowly improving but was still on 4 L of oxygen on the day he signed out AMA.  Plans were to continue with steroids-he was started on remdesivir.  Unfortunately late in the evening-when this MD was not in shift-patient signed out AMA.  AKI on CKD stage IIIa: : Likely hemodynamically mediated-plans were to treat with supportive care.  Abdominal pain: Suspect secondary to COVID-19-and probably constipation.  CT of the abdomen was negative for acute abnormalities.  Plans were to start laxatives and other supportive care.  Chronic pain syndrome/chronic back pain:  He was continued on his usual narcotic regimen.  TODAY-DAY OF DISCHARGE:  Subjective:   Demont Linford today has signed out against medical advice. He was warned about the life threatening and life disabling effects by  RN.   Objective:   Blood pressure 124/78, pulse 91, temperature 99 F (37.2 C), temperature source Oral, resp. rate 19, height 5\' 8"  (1.727 m), weight 79.9 kg, SpO2 94 %.   DISCHARGE CONDITION: Not stable for discharge-left AMA  DISPOSITION: AMA   Follow with your PCP in 1 week   Total Time spent on discharge equals 25  minutes.  Signed 07/20/2019 4:52 PM

## 2019-07-23 LAB — CULTURE, BLOOD (ROUTINE X 2)
Culture: NO GROWTH
Special Requests: ADEQUATE

## 2019-07-31 DIAGNOSIS — G894 Chronic pain syndrome: Secondary | ICD-10-CM | POA: Diagnosis not present

## 2019-07-31 DIAGNOSIS — M545 Low back pain: Secondary | ICD-10-CM | POA: Diagnosis not present

## 2019-07-31 DIAGNOSIS — M5134 Other intervertebral disc degeneration, thoracic region: Secondary | ICD-10-CM | POA: Diagnosis not present

## 2019-07-31 DIAGNOSIS — M546 Pain in thoracic spine: Secondary | ICD-10-CM | POA: Diagnosis not present

## 2019-08-28 DIAGNOSIS — M546 Pain in thoracic spine: Secondary | ICD-10-CM | POA: Diagnosis not present

## 2019-08-28 DIAGNOSIS — G894 Chronic pain syndrome: Secondary | ICD-10-CM | POA: Diagnosis not present

## 2019-08-28 DIAGNOSIS — M259 Joint disorder, unspecified: Secondary | ICD-10-CM | POA: Diagnosis not present

## 2019-08-28 DIAGNOSIS — M5136 Other intervertebral disc degeneration, lumbar region: Secondary | ICD-10-CM | POA: Diagnosis not present

## 2019-08-28 DIAGNOSIS — M545 Low back pain: Secondary | ICD-10-CM | POA: Diagnosis not present

## 2019-08-28 DIAGNOSIS — M5134 Other intervertebral disc degeneration, thoracic region: Secondary | ICD-10-CM | POA: Diagnosis not present

## 2019-09-04 DIAGNOSIS — M47816 Spondylosis without myelopathy or radiculopathy, lumbar region: Secondary | ICD-10-CM | POA: Diagnosis not present

## 2019-09-25 DIAGNOSIS — M5136 Other intervertebral disc degeneration, lumbar region: Secondary | ICD-10-CM | POA: Diagnosis not present

## 2019-09-25 DIAGNOSIS — M545 Low back pain: Secondary | ICD-10-CM | POA: Diagnosis not present

## 2019-09-25 DIAGNOSIS — Z5181 Encounter for therapeutic drug level monitoring: Secondary | ICD-10-CM | POA: Diagnosis not present

## 2019-09-25 DIAGNOSIS — Z79891 Long term (current) use of opiate analgesic: Secondary | ICD-10-CM | POA: Diagnosis not present

## 2019-09-25 DIAGNOSIS — M5134 Other intervertebral disc degeneration, thoracic region: Secondary | ICD-10-CM | POA: Diagnosis not present

## 2019-09-25 DIAGNOSIS — M546 Pain in thoracic spine: Secondary | ICD-10-CM | POA: Diagnosis not present

## 2019-09-25 DIAGNOSIS — G894 Chronic pain syndrome: Secondary | ICD-10-CM | POA: Diagnosis not present

## 2019-10-23 DIAGNOSIS — G894 Chronic pain syndrome: Secondary | ICD-10-CM | POA: Diagnosis not present

## 2019-10-23 DIAGNOSIS — M546 Pain in thoracic spine: Secondary | ICD-10-CM | POA: Diagnosis not present

## 2019-10-23 DIAGNOSIS — M5136 Other intervertebral disc degeneration, lumbar region: Secondary | ICD-10-CM | POA: Diagnosis not present

## 2019-10-23 DIAGNOSIS — M5134 Other intervertebral disc degeneration, thoracic region: Secondary | ICD-10-CM | POA: Diagnosis not present

## 2019-11-20 DIAGNOSIS — M5136 Other intervertebral disc degeneration, lumbar region: Secondary | ICD-10-CM | POA: Diagnosis not present

## 2019-11-20 DIAGNOSIS — G8929 Other chronic pain: Secondary | ICD-10-CM | POA: Diagnosis not present

## 2019-11-20 DIAGNOSIS — M792 Neuralgia and neuritis, unspecified: Secondary | ICD-10-CM | POA: Diagnosis not present

## 2019-11-20 DIAGNOSIS — M25559 Pain in unspecified hip: Secondary | ICD-10-CM | POA: Diagnosis not present

## 2019-11-20 DIAGNOSIS — M48062 Spinal stenosis, lumbar region with neurogenic claudication: Secondary | ICD-10-CM | POA: Diagnosis not present

## 2019-11-20 DIAGNOSIS — M545 Low back pain: Secondary | ICD-10-CM | POA: Diagnosis not present

## 2019-11-20 DIAGNOSIS — G894 Chronic pain syndrome: Secondary | ICD-10-CM | POA: Diagnosis not present

## 2019-11-20 DIAGNOSIS — M47897 Other spondylosis, lumbosacral region: Secondary | ICD-10-CM | POA: Diagnosis not present

## 2019-11-20 DIAGNOSIS — I70213 Atherosclerosis of native arteries of extremities with intermittent claudication, bilateral legs: Secondary | ICD-10-CM | POA: Diagnosis not present

## 2019-11-20 DIAGNOSIS — M259 Joint disorder, unspecified: Secondary | ICD-10-CM | POA: Diagnosis not present

## 2019-12-18 DIAGNOSIS — M545 Low back pain: Secondary | ICD-10-CM | POA: Diagnosis not present

## 2019-12-18 DIAGNOSIS — G894 Chronic pain syndrome: Secondary | ICD-10-CM | POA: Diagnosis not present

## 2019-12-18 DIAGNOSIS — M5136 Other intervertebral disc degeneration, lumbar region: Secondary | ICD-10-CM | POA: Diagnosis not present

## 2019-12-18 DIAGNOSIS — M5134 Other intervertebral disc degeneration, thoracic region: Secondary | ICD-10-CM | POA: Diagnosis not present

## 2020-01-15 DIAGNOSIS — G894 Chronic pain syndrome: Secondary | ICD-10-CM | POA: Diagnosis not present

## 2020-01-15 DIAGNOSIS — M5136 Other intervertebral disc degeneration, lumbar region: Secondary | ICD-10-CM | POA: Diagnosis not present

## 2020-01-15 DIAGNOSIS — M546 Pain in thoracic spine: Secondary | ICD-10-CM | POA: Diagnosis not present

## 2020-01-15 DIAGNOSIS — M5134 Other intervertebral disc degeneration, thoracic region: Secondary | ICD-10-CM | POA: Diagnosis not present

## 2020-01-16 DIAGNOSIS — G894 Chronic pain syndrome: Secondary | ICD-10-CM | POA: Diagnosis not present

## 2020-02-12 DIAGNOSIS — M545 Low back pain: Secondary | ICD-10-CM | POA: Diagnosis not present

## 2020-02-12 DIAGNOSIS — M5134 Other intervertebral disc degeneration, thoracic region: Secondary | ICD-10-CM | POA: Diagnosis not present

## 2020-02-12 DIAGNOSIS — M5136 Other intervertebral disc degeneration, lumbar region: Secondary | ICD-10-CM | POA: Diagnosis not present

## 2020-02-12 DIAGNOSIS — M546 Pain in thoracic spine: Secondary | ICD-10-CM | POA: Diagnosis not present

## 2020-03-11 DIAGNOSIS — G894 Chronic pain syndrome: Secondary | ICD-10-CM | POA: Diagnosis not present

## 2020-03-17 DIAGNOSIS — G894 Chronic pain syndrome: Secondary | ICD-10-CM | POA: Diagnosis not present

## 2020-04-09 DIAGNOSIS — G8929 Other chronic pain: Secondary | ICD-10-CM | POA: Diagnosis not present

## 2020-04-09 DIAGNOSIS — I70213 Atherosclerosis of native arteries of extremities with intermittent claudication, bilateral legs: Secondary | ICD-10-CM | POA: Diagnosis not present

## 2020-04-09 DIAGNOSIS — M25559 Pain in unspecified hip: Secondary | ICD-10-CM | POA: Diagnosis not present

## 2020-04-09 DIAGNOSIS — M792 Neuralgia and neuritis, unspecified: Secondary | ICD-10-CM | POA: Diagnosis not present

## 2020-04-09 DIAGNOSIS — M546 Pain in thoracic spine: Secondary | ICD-10-CM | POA: Diagnosis not present

## 2020-04-09 DIAGNOSIS — M5136 Other intervertebral disc degeneration, lumbar region: Secondary | ICD-10-CM | POA: Diagnosis not present

## 2020-04-09 DIAGNOSIS — M5134 Other intervertebral disc degeneration, thoracic region: Secondary | ICD-10-CM | POA: Diagnosis not present

## 2020-04-09 DIAGNOSIS — M25519 Pain in unspecified shoulder: Secondary | ICD-10-CM | POA: Diagnosis not present

## 2020-04-09 DIAGNOSIS — M48062 Spinal stenosis, lumbar region with neurogenic claudication: Secondary | ICD-10-CM | POA: Diagnosis not present

## 2020-04-09 DIAGNOSIS — M47897 Other spondylosis, lumbosacral region: Secondary | ICD-10-CM | POA: Diagnosis not present

## 2020-04-09 DIAGNOSIS — M545 Low back pain: Secondary | ICD-10-CM | POA: Diagnosis not present

## 2020-05-06 DIAGNOSIS — M5134 Other intervertebral disc degeneration, thoracic region: Secondary | ICD-10-CM | POA: Diagnosis not present

## 2020-05-06 DIAGNOSIS — G894 Chronic pain syndrome: Secondary | ICD-10-CM | POA: Diagnosis not present

## 2020-05-06 DIAGNOSIS — M546 Pain in thoracic spine: Secondary | ICD-10-CM | POA: Diagnosis not present

## 2020-05-06 DIAGNOSIS — M5136 Other intervertebral disc degeneration, lumbar region: Secondary | ICD-10-CM | POA: Diagnosis not present

## 2020-05-16 DIAGNOSIS — G894 Chronic pain syndrome: Secondary | ICD-10-CM | POA: Diagnosis not present

## 2020-06-03 DIAGNOSIS — M5136 Other intervertebral disc degeneration, lumbar region: Secondary | ICD-10-CM | POA: Diagnosis not present

## 2020-06-03 DIAGNOSIS — M546 Pain in thoracic spine: Secondary | ICD-10-CM | POA: Diagnosis not present

## 2020-06-03 DIAGNOSIS — G894 Chronic pain syndrome: Secondary | ICD-10-CM | POA: Diagnosis not present

## 2020-06-03 DIAGNOSIS — M5134 Other intervertebral disc degeneration, thoracic region: Secondary | ICD-10-CM | POA: Diagnosis not present

## 2020-06-15 DIAGNOSIS — G894 Chronic pain syndrome: Secondary | ICD-10-CM | POA: Diagnosis not present

## 2020-07-01 DIAGNOSIS — M5136 Other intervertebral disc degeneration, lumbar region: Secondary | ICD-10-CM | POA: Diagnosis not present

## 2020-07-01 DIAGNOSIS — G894 Chronic pain syndrome: Secondary | ICD-10-CM | POA: Diagnosis not present

## 2020-07-01 DIAGNOSIS — M5134 Other intervertebral disc degeneration, thoracic region: Secondary | ICD-10-CM | POA: Diagnosis not present

## 2020-07-01 DIAGNOSIS — M546 Pain in thoracic spine: Secondary | ICD-10-CM | POA: Diagnosis not present

## 2020-07-15 DIAGNOSIS — G894 Chronic pain syndrome: Secondary | ICD-10-CM | POA: Diagnosis not present

## 2020-07-31 DIAGNOSIS — M259 Joint disorder, unspecified: Secondary | ICD-10-CM | POA: Diagnosis not present

## 2020-07-31 DIAGNOSIS — M5134 Other intervertebral disc degeneration, thoracic region: Secondary | ICD-10-CM | POA: Diagnosis not present

## 2020-07-31 DIAGNOSIS — M546 Pain in thoracic spine: Secondary | ICD-10-CM | POA: Diagnosis not present

## 2020-07-31 DIAGNOSIS — M5136 Other intervertebral disc degeneration, lumbar region: Secondary | ICD-10-CM | POA: Diagnosis not present

## 2020-08-07 ENCOUNTER — Encounter: Payer: Self-pay | Admitting: Nurse Practitioner

## 2020-08-07 ENCOUNTER — Ambulatory Visit (INDEPENDENT_AMBULATORY_CARE_PROVIDER_SITE_OTHER): Payer: PPO | Admitting: Nurse Practitioner

## 2020-08-07 ENCOUNTER — Other Ambulatory Visit: Payer: Self-pay

## 2020-08-07 VITALS — BP 135/86 | HR 59 | Temp 98.4°F | Ht 68.0 in | Wt 185.4 lb

## 2020-08-07 DIAGNOSIS — I10 Essential (primary) hypertension: Secondary | ICD-10-CM | POA: Diagnosis not present

## 2020-08-07 DIAGNOSIS — Z13220 Encounter for screening for lipoid disorders: Secondary | ICD-10-CM | POA: Diagnosis not present

## 2020-08-07 DIAGNOSIS — R030 Elevated blood-pressure reading, without diagnosis of hypertension: Secondary | ICD-10-CM | POA: Diagnosis not present

## 2020-08-07 DIAGNOSIS — R001 Bradycardia, unspecified: Secondary | ICD-10-CM | POA: Diagnosis not present

## 2020-08-07 DIAGNOSIS — Z131 Encounter for screening for diabetes mellitus: Secondary | ICD-10-CM

## 2020-08-07 DIAGNOSIS — Z7689 Persons encountering health services in other specified circumstances: Secondary | ICD-10-CM | POA: Diagnosis not present

## 2020-08-07 DIAGNOSIS — Z125 Encounter for screening for malignant neoplasm of prostate: Secondary | ICD-10-CM | POA: Diagnosis not present

## 2020-08-07 LAB — POCT GLYCOSYLATED HEMOGLOBIN (HGB A1C): Hemoglobin A1C: 5.5 % (ref 4.0–5.6)

## 2020-08-07 LAB — POCT URINALYSIS DIPSTICK
Bilirubin, UA: NEGATIVE
Blood, UA: NEGATIVE
Glucose, UA: NEGATIVE
Ketones, UA: NEGATIVE
Leukocytes, UA: NEGATIVE
Nitrite, UA: NEGATIVE
Protein, UA: NEGATIVE
Spec Grav, UA: >=1.03 — AB (ref 1.010–1.025)
Urobilinogen, UA: 0.2 E.U./dL
pH, UA: 5.5 (ref 5.0–8.0)

## 2020-08-07 LAB — POCT CBG (FASTING - GLUCOSE)-MANUAL ENTRY: Glucose Fasting, POC: 100 mg/dL — AB (ref 70–99)

## 2020-08-07 MED ORDER — LISINOPRIL 10 MG PO TABS: 10.0000 mg | ORAL_TABLET | Freq: Every day | ORAL | 3 refills | Status: AC

## 2020-08-07 MED ORDER — CLONIDINE HCL 0.1 MG PO TABS: 0.1000 mg | ORAL_TABLET | Freq: Once | ORAL | Status: AC

## 2020-08-07 MED ORDER — LISINOPRIL 10 MG PO TABS: 10.0000 mg | ORAL_TABLET | Freq: Every day | ORAL | 3 refills | Status: DC

## 2020-08-07 NOTE — Progress Notes (Signed)
[facility] Patient [facility] [address] [place], [place]  [address] Phone:  [phone]   Fax:  [phone]   New Patient Office Visit  Subjective:  Patient ID: Anthony Landry, male    DOB: 1966-02-18  Age: 55 y.o. MRN: [id number]  CC:  Chief Complaint  Patient presents with  . New Patient (Initial Visit)    New pt , est care  in pain management, discuss htn      HPI WYAT INFINGER presents to establish care. He  has a past medical history of Arthritis and Neuromuscular disorder (HCC).   Hypertension Patient is here for evaluation of elevated blood pressures. Cardiac symptoms: none. Patient denies chest pain, dyspnea, fatigue, irregular heart beat, lower extremity edema, palpitations, syncope and tachypnea. Cardiovascular risk factors: advanced age (older than 15 for men, 42 for women), hypertension, male gender and sedentary lifestyle. Use of agents associated with hypertension: none. History of target organ damage: none. He has gained weight over the last 2 yrs. Average wt was 130 pounds.    Past Medical History:  Diagnosis Date  . Arthritis   . Neuromuscular disorder [facility])     Past Surgical History:  Procedure Laterality Date  . BACK SURGERY  1985  . KNEE SURGERY Right 1997    Family History  Problem Relation Age of Onset  . Cancer Mother   . Heart disease Mother   . Cancer Father     Social History   Socioeconomic History  . Marital status: Divorced    Spouse name: Not on file  . Number of children: 2  . Years of education: 10  . Highest education level: 10th grade  Occupational History  . Occupation: disabled  Tobacco Use  . Smoking status: Never Smoker  . Smokeless tobacco: Never Used  Vaping Use  . Vaping Use: Never used  Substance and Sexual Activity  . Alcohol use: No  . Drug use: No  . Sexual activity: Yes    Partners: Male  Other Topics Concern  . Not on file  Social History Narrative  . Not on file   Social Determinants of  Health   Financial Resource Strain: Not on file  Food Insecurity: Not on file  Transportation Needs: Not on file  Physical Activity: Not on file  Stress: Not on file  Social Connections: Not on file  Intimate Partner Violence: Not on file    ROS Review of Systems  Constitutional: Negative.   HENT: Negative.   Eyes: Negative.   Respiratory: Negative.   Cardiovascular: Negative.   Gastrointestinal: Negative.   Endocrine: Negative.   Genitourinary: Negative.   Musculoskeletal: Negative.   Skin: Negative.   Allergic/Immunologic: Negative.   Neurological: Negative.   Hematological: Negative.   Psychiatric/Behavioral: Negative.     Objective:   Today's Vitals: BP 135/86 (BP Location: Right Arm, Patient Position: Sitting, Cuff Size: Normal)   Pulse (!) 59   Temp 98.4 F (36.9 C) (Temporal)   Ht 5\' 8"  (1.727 m)   Wt 185 lb 6.4 oz (84.1 kg)   SpO2 98%   BMI 28.19 kg/m   Physical Exam Constitutional:      General: He is not in acute distress.    Appearance: He is not ill-appearing, toxic-appearing or diaphoretic.  HENT:     Head: Normocephalic and atraumatic.     Nose: Nose normal.     Mouth/Throat:     Mouth: Mucous membranes are moist.  Cardiovascular:  Rate and Rhythm: Normal rate and regular rhythm.     Pulses: Normal pulses.     Heart sounds: Normal heart sounds.  Pulmonary:     Effort: Pulmonary effort is normal.     Breath sounds: Normal breath sounds.  Abdominal:     General: Bowel sounds are normal.     Palpations: Abdomen is soft.  Musculoskeletal:        General: Normal range of motion.     Cervical back: Normal range of motion.  Skin:    General: Skin is warm and dry.     Capillary Refill: Capillary refill takes less than 2 seconds.  Neurological:     General: No focal deficit present.     Mental Status: He is alert and oriented to person, place, and time.  Psychiatric:        Mood and Affect: Mood normal.        Behavior: Behavior normal.         Thought Content: Thought content normal.        Judgment: Judgment normal.     Assessment & Plan:   Problem List Items Addressed This Visit   None   Visit Diagnoses    Elevated blood pressure reading    -  Primary Encouraged on going compliance with current medication regimen Encouraged home monitoring and recording BP <130/80 Eating a heart-healthy diet with less salt Encouraged regular physical activity  Recommend Weight loss `   Relevant Medications   cloNIDine (CATAPRES) tablet 0.1 mg   Other Relevant Orders   POCT Urinalysis Dipstick   Microalbumin/Creatinine Ratio, Urine   Comp. Metabolic Panel (12)   Encounter to establish care       Relevant Orders   POCT Urinalysis Dipstick   CBC with Differential/Platelet   Screening for diabetes mellitus (DM)       Relevant Orders   POCT Urinalysis Dipstick   POCT glycosylated hemoglobin (Hb A1C) (Completed)   POCT CBG (Fasting - Glucose) (Completed)   Screening for cholesterol level       Relevant Orders   Lipid panel   Primary hypertension       Relevant Medications   cloNIDine (CATAPRES) tablet 0.1 mg   lisinopril (ZESTRIL) 10 MG tablet   Heart rate less than 60 beats per minute     Will continue to monitor. If abnormal at next visit ECG TBC      Outpatient Encounter Medications as of 08/07/2020  Medication Sig  . gabapentin (NEURONTIN) 100 MG capsule Take by mouth.  . morphine (MS CONTIN) 60 MG 12 hr tablet Limit one tab by mouth every 8-12 hours if tolerated  . oxyCODONE (ROXICODONE) 15 MG immediate release tablet Limit 1-4 tablets by mouth per day for breakthrough pain while taking MS Contin if tolerated  . tiZANidine (ZANAFLEX) 2 MG tablet Take 2 mg by mouth 2 (two) times daily.  . [DISCONTINUED] lisinopril (ZESTRIL) 10 MG tablet Take 1 tablet (10 mg total) by mouth daily.  Marland Kitchen lisinopril (ZESTRIL) 10 MG tablet Take 1 tablet (10 mg total) by mouth daily.  . [DISCONTINUED] Ibuprofen 200 MG CAPS Take by mouth.  Takes 4 capsules as needed (Patient not taking: Reported on 08/07/2020)   Facility-Administered Encounter Medications as of 08/07/2020  Medication  . cloNIDine (CATAPRES) tablet 0.1 mg    Follow-up: Return in about 4 weeks (around 09/04/2020) for follow up HTN.   Barbette Merino, NP

## 2020-08-07 NOTE — Patient Instructions (Addendum)
Hypertension, Adult Hypertension is another name for high blood pressure. High blood pressure forces your heart to work harder to pump blood. This can cause problems over time. There are two numbers in a blood pressure reading. There is a top number (systolic) over a bottom number (diastolic). It is best to have a blood pressure that is below 120/80. Healthy choices can help lower your blood pressure, or you may need medicine to help lower it. What are the causes? The cause of this condition is not known. Some conditions may be related to high blood pressure. What increases the risk?  Smoking.  Having type 2 diabetes mellitus, high cholesterol, or both.  Not getting enough exercise or physical activity.  Being overweight.  Having too much fat, sugar, calories, or salt (sodium) in your diet.  Drinking too much alcohol.  Having long-term (chronic) kidney disease.  Having a family history of high blood pressure.  Age. Risk increases with age.  Race. You may be at higher risk if you are African American.  Gender. Men are at higher risk than women before age 45. After age 65, women are at higher risk than men.  Having obstructive sleep apnea.  Stress. What are the signs or symptoms?  High blood pressure may not cause symptoms. Very high blood pressure (hypertensive crisis) may cause: ? Headache. ? Feelings of worry or nervousness (anxiety). ? Shortness of breath. ? Nosebleed. ? A feeling of being sick to your stomach (nausea). ? Throwing up (vomiting). ? Changes in how you see. ? Very bad chest pain. ? Seizures. How is this treated?  This condition is treated by making healthy lifestyle changes, such as: ? Eating healthy foods. ? Exercising more. ? Drinking less alcohol.  Your health care provider may prescribe medicine if lifestyle changes are not enough to get your blood pressure under control, and if: ? Your top number is above 130. ? Your bottom number is above  80.  Your personal target blood pressure may vary. Follow these instructions at home: Eating and drinking   If told, follow the DASH eating plan. To follow this plan: ? Fill one half of your plate at each meal with fruits and vegetables. ? Fill one fourth of your plate at each meal with whole grains. Whole grains include whole-wheat pasta, brown rice, and whole-grain bread. ? Eat or drink low-fat dairy products, such as skim milk or low-fat yogurt. ? Fill one fourth of your plate at each meal with low-fat (lean) proteins. Low-fat proteins include fish, chicken without skin, eggs, beans, and tofu. ? Avoid fatty meat, cured and processed meat, or chicken with skin. ? Avoid pre-made or processed food.  Eat less than 1,500 mg of salt each day.  Do not drink alcohol if: ? Your doctor tells you not to drink. ? You are pregnant, may be pregnant, or are planning to become pregnant.  If you drink alcohol: ? Limit how much you use to:  0-1 drink a day for women.  0-2 drinks a day for men. ? Be aware of how much alcohol is in your drink. In the [place] one drink equals one 12 oz bottle of beer (355 mL), one 5 oz glass of wine (148 mL), or one 1 oz glass of hard liquor (44 mL). Lifestyle   Work with your doctor to stay at a healthy weight or to lose weight. Ask your doctor what the best weight is for you.  Get at least 30 minutes of exercise most   days of the week. This may include walking, swimming, or biking.  Get at least 30 minutes of exercise that strengthens your muscles (resistance exercise) at least 3 days a week. This may include lifting weights or doing Pilates.  Do not use any products that contain nicotine or tobacco, such as cigarettes, e-cigarettes, and chewing tobacco. If you need help quitting, ask your doctor.  Check your blood pressure at home as told by your doctor.  Keep all follow-up visits as told by your doctor. This is important. Medicines  Take over-the-counter  and prescription medicines only as told by your doctor. Follow directions carefully.  Do not skip doses of blood pressure medicine. The medicine does not work as well if you skip doses. Skipping doses also puts you at risk for problems.  Ask your doctor about side effects or reactions to medicines that you should watch for. Contact a doctor if you:  Think you are having a reaction to the medicine you are taking.  Have headaches that keep coming back (recurring).  Feel dizzy.  Have swelling in your ankles.  Have trouble with your vision. Get help right away if you:  Get a very bad headache.  Start to feel mixed up (confused).  Feel weak or numb.  Feel faint.  Have very bad pain in your: ? Chest. ? Belly (abdomen).  Throw up more than once.  Have trouble breathing. Summary  Hypertension is another name for high blood pressure.  High blood pressure forces your heart to work harder to pump blood.  For most people, a normal blood pressure is less than 120/80.  Making healthy choices can help lower blood pressure. If your blood pressure does not get lower with healthy choices, you may need to take medicine. This information is not intended to replace advice given to you by your health care provider. Make sure you discuss any questions you have with your health care provider. Document Revised: 03/30/2018 Document Reviewed: 03/30/2018 Elsevier Patient Education  2020 [employer] Health Maintenance, Male Adopting a healthy lifestyle and getting preventive care are important in promoting health and wellness. Ask your health care provider about:  The right schedule for you to have regular tests and exams.  Things you can do on your own to prevent diseases and keep yourself healthy. What should I know about diet, weight, and exercise? Eat a healthy diet   Eat a diet that includes plenty of vegetables, fruits, low-fat dairy products, and lean protein.  Do not eat a lot  of foods that are high in solid fats, added sugars, or sodium. Maintain a healthy weight Body mass index (BMI) is a measurement that can be used to identify possible weight problems. It estimates body fat based on height and weight. Your health care provider can help determine your BMI and help you achieve or maintain a healthy weight. Get regular exercise Get regular exercise. This is one of the most important things you can do for your health. Most adults should:  Exercise for at least 150 minutes each week. The exercise should increase your heart rate and make you sweat (moderate-intensity exercise).  Do strengthening exercises at least twice a week. This is in addition to the moderate-intensity exercise.  Spend less time sitting. Even light physical activity can be beneficial. Watch cholesterol and blood lipids Have your blood tested for lipids and cholesterol at 57 years of age, then have this test every 5 years. You may need to have your cholesterol levels checked   more often if:  Your lipid or cholesterol levels are high.  You are older than 57 years of age.  You are at high risk for heart disease. What should I know about cancer screening? Many types of cancers can be detected early and may often be prevented. Depending on your health history and family history, you may need to have cancer screening at various ages. This may include screening for:  Colorectal cancer.  Prostate cancer.  Skin cancer.  Lung cancer. What should I know about heart disease, diabetes, and high blood pressure? Blood pressure and heart disease  High blood pressure causes heart disease and increases the risk of stroke. This is more likely to develop in people who have high blood pressure readings, are of African descent, or are overweight.  Talk with your health care provider about your target blood pressure readings.  Have your blood pressure checked: ? Every 3-5 years if you are 18-39 years of  age. ? Every year if you are 40 years old or older.  If you are between the ages of 65 and 75 and are a current or former smoker, ask your health care provider if you should have a one-time screening for abdominal aortic aneurysm (AAA). Diabetes Have regular diabetes screenings. This checks your fasting blood sugar level. Have the screening done:  Once every three years after age 45 if you are at a normal weight and have a low risk for diabetes.  More often and at a younger age if you are overweight or have a high risk for diabetes. What should I know about preventing infection? Hepatitis B If you have a higher risk for hepatitis B, you should be screened for this virus. Talk with your health care provider to find out if you are at risk for hepatitis B infection. Hepatitis C Blood testing is recommended for:  Everyone born from 1945 through 1965.  Anyone with known risk factors for hepatitis C. Sexually transmitted infections (STIs)  You should be screened each year for STIs, including gonorrhea and chlamydia, if: ? You are sexually active and are younger than 57 years of age. ? You are older than 57 years of age and your health care provider tells you that you are at risk for this type of infection. ? Your sexual activity has changed since you were last screened, and you are at increased risk for chlamydia or gonorrhea. Ask your health care provider if you are at risk.  Ask your health care provider about whether you are at high risk for HIV. Your health care provider may recommend a prescription medicine to help prevent HIV infection. If you choose to take medicine to prevent HIV, you should first get tested for HIV. You should then be tested every 3 months for as long as you are taking the medicine. Follow these instructions at home: Lifestyle  Do not use any products that contain nicotine or tobacco, such as cigarettes, e-cigarettes, and chewing tobacco. If you need help quitting,  ask your health care provider.  Do not use street drugs.  Do not share needles.  Ask your health care provider for help if you need support or information about quitting drugs. Alcohol use  Do not drink alcohol if your health care provider tells you not to drink.  If you drink alcohol: ? Limit how much you have to 0-2 drinks a day. ? Be aware of how much alcohol is in your drink. In the [place] one drink equals one 12   oz bottle of beer (355 mL), one 5 oz glass of wine (148 mL), or one 1 oz glass of hard liquor (44 mL). General instructions  Schedule regular health, dental, and eye exams.  Stay current with your vaccines.  Tell your health care provider if: ? You often feel depressed. ? You have ever been abused or do not feel safe at home. Summary  Adopting a healthy lifestyle and getting preventive care are important in promoting health and wellness.  Follow your health care provider's instructions about healthy diet, exercising, and getting tested or screened for diseases.  Follow your health care provider's instructions on monitoring your cholesterol and blood pressure. This information is not intended to replace advice given to you by your health care provider. Make sure you discuss any questions you have with your health care provider. Document Revised: 07/13/2018 Document Reviewed: 07/13/2018 Elsevier Patient Education  2020 Elsevier Inc.  

## 2020-08-08 ENCOUNTER — Encounter: Payer: Self-pay | Admitting: Nurse Practitioner

## 2020-08-08 LAB — COMP. METABOLIC PANEL (12)
AST: 20 IU/L (ref 0–40)
Albumin/Globulin Ratio: 1.8 (ref 1.2–2.2)
Albumin: 4.6 g/dL (ref 3.8–4.9)
Alkaline Phosphatase: 50 IU/L (ref 44–121)
BUN/Creatinine Ratio: 10 (ref 9–20)
BUN: 15 mg/dL (ref 6–24)
Bilirubin Total: 0.6 mg/dL (ref 0.0–1.2)
Calcium: 9.4 mg/dL (ref 8.7–10.2)
Chloride: 104 mmol/L (ref 96–106)
Creatinine, Ser: 1.43 mg/dL — ABNORMAL HIGH (ref 0.76–1.27)
GFR calc Af Amer: 63 mL/min/{1.73_m2} (ref >59)
GFR calc non Af Amer: 54 mL/min/{1.73_m2} — ABNORMAL LOW (ref >59)
Globulin, Total: 2.5 g/dL (ref 1.5–4.5)
Glucose: 104 mg/dL — ABNORMAL HIGH (ref 65–99)
Potassium: 4.1 mmol/L (ref 3.5–5.2)
Sodium: 141 mmol/L (ref 134–144)
Total Protein: 7.1 g/dL (ref 6.0–8.5)

## 2020-08-08 LAB — CBC WITH DIFFERENTIAL/PLATELET
Basophils Absolute: 0.1 10*3/uL (ref 0.0–0.2)
Basos: 1 %
EOS (ABSOLUTE): 0.2 10*3/uL (ref 0.0–0.4)
Eos: 3 %
Hematocrit: 46.4 % (ref 37.5–51.0)
Hemoglobin: 16.2 g/dL (ref 13.0–17.7)
Immature Grans (Abs): 0 10*3/uL (ref 0.0–0.1)
Immature Granulocytes: 0 %
Lymphocytes Absolute: 2.6 10*3/uL (ref 0.7–3.1)
Lymphs: 37 %
MCH: 31.8 pg (ref 26.6–33.0)
MCHC: 34.9 g/dL (ref 31.5–35.7)
MCV: 91 fL (ref 79–97)
Monocytes Absolute: 0.5 10*3/uL (ref 0.1–0.9)
Monocytes: 6 %
Neutrophils Absolute: 3.8 10*3/uL (ref 1.4–7.0)
Neutrophils: 53 %
Platelets: 299 10*3/uL (ref 150–450)
RBC: 5.1 x10E6/uL (ref 4.14–5.80)
RDW: 12.3 % (ref 11.6–15.4)
WBC: 7.2 10*3/uL (ref 3.4–10.8)

## 2020-08-08 LAB — LIPID PANEL
Chol/HDL Ratio: 4.8 ratio (ref 0.0–5.0)
Cholesterol, Total: 253 mg/dL — ABNORMAL HIGH (ref 100–199)
HDL: 53 mg/dL (ref >39)
LDL Chol Calc (NIH): 176 mg/dL — ABNORMAL HIGH (ref 0–99)
Triglycerides: 132 mg/dL (ref 0–149)
VLDL Cholesterol Cal: 24 mg/dL (ref 5–40)

## 2020-08-08 LAB — MICROALBUMIN / CREATININE URINE RATIO
Creatinine, Urine: 243.6 mg/dL
Microalb/Creat Ratio: 3 mg/g creat (ref 0–29)
Microalbumin, Urine: 6.2 ug/mL

## 2020-08-08 LAB — PSA: Prostate Specific Ag, Serum: 2.4 ng/mL (ref 0.0–4.0)

## 2020-08-14 DIAGNOSIS — G894 Chronic pain syndrome: Secondary | ICD-10-CM | POA: Diagnosis not present

## 2020-08-26 DIAGNOSIS — M5134 Other intervertebral disc degeneration, thoracic region: Secondary | ICD-10-CM | POA: Diagnosis not present

## 2020-08-26 DIAGNOSIS — M5136 Other intervertebral disc degeneration, lumbar region: Secondary | ICD-10-CM | POA: Diagnosis not present

## 2020-08-26 DIAGNOSIS — M546 Pain in thoracic spine: Secondary | ICD-10-CM | POA: Diagnosis not present

## 2020-08-26 DIAGNOSIS — M259 Joint disorder, unspecified: Secondary | ICD-10-CM | POA: Diagnosis not present

## 2020-08-28 ENCOUNTER — Other Ambulatory Visit: Payer: Self-pay

## 2020-08-28 ENCOUNTER — Ambulatory Visit: Payer: PPO | Admitting: Nurse Practitioner

## 2020-08-28 VITALS — BP 131/82 | HR 62

## 2020-08-28 DIAGNOSIS — I10 Essential (primary) hypertension: Secondary | ICD-10-CM

## 2020-08-28 NOTE — Progress Notes (Signed)
Pt came into today for a b/p check.

## 2020-09-13 DIAGNOSIS — M5134 Other intervertebral disc degeneration, thoracic region: Secondary | ICD-10-CM | POA: Diagnosis not present

## 2020-09-13 DIAGNOSIS — M546 Pain in thoracic spine: Secondary | ICD-10-CM | POA: Diagnosis not present

## 2020-09-13 DIAGNOSIS — G894 Chronic pain syndrome: Secondary | ICD-10-CM | POA: Diagnosis not present

## 2020-09-13 DIAGNOSIS — M5136 Other intervertebral disc degeneration, lumbar region: Secondary | ICD-10-CM | POA: Diagnosis not present

## 2020-09-23 DIAGNOSIS — M546 Pain in thoracic spine: Secondary | ICD-10-CM | POA: Diagnosis not present

## 2020-09-23 DIAGNOSIS — M5136 Other intervertebral disc degeneration, lumbar region: Secondary | ICD-10-CM | POA: Diagnosis not present

## 2020-09-23 DIAGNOSIS — M5134 Other intervertebral disc degeneration, thoracic region: Secondary | ICD-10-CM | POA: Diagnosis not present

## 2020-09-23 DIAGNOSIS — G894 Chronic pain syndrome: Secondary | ICD-10-CM | POA: Diagnosis not present

## 2020-10-13 DIAGNOSIS — G894 Chronic pain syndrome: Secondary | ICD-10-CM | POA: Diagnosis not present

## 2020-10-21 DIAGNOSIS — G894 Chronic pain syndrome: Secondary | ICD-10-CM | POA: Diagnosis not present

## 2020-11-12 DIAGNOSIS — G894 Chronic pain syndrome: Secondary | ICD-10-CM | POA: Diagnosis not present

## 2020-11-18 DIAGNOSIS — M47897 Other spondylosis, lumbosacral region: Secondary | ICD-10-CM | POA: Diagnosis not present

## 2020-11-18 DIAGNOSIS — M792 Neuralgia and neuritis, unspecified: Secondary | ICD-10-CM | POA: Diagnosis not present

## 2020-11-18 DIAGNOSIS — G8929 Other chronic pain: Secondary | ICD-10-CM | POA: Diagnosis not present

## 2020-11-18 DIAGNOSIS — M79669 Pain in unspecified lower leg: Secondary | ICD-10-CM | POA: Diagnosis not present

## 2020-11-18 DIAGNOSIS — M545 Low back pain, unspecified: Secondary | ICD-10-CM | POA: Diagnosis not present

## 2020-11-18 DIAGNOSIS — M25559 Pain in unspecified hip: Secondary | ICD-10-CM | POA: Diagnosis not present

## 2020-11-18 DIAGNOSIS — M5136 Other intervertebral disc degeneration, lumbar region: Secondary | ICD-10-CM | POA: Diagnosis not present

## 2020-11-18 DIAGNOSIS — G894 Chronic pain syndrome: Secondary | ICD-10-CM | POA: Diagnosis not present

## 2020-11-18 DIAGNOSIS — M48062 Spinal stenosis, lumbar region with neurogenic claudication: Secondary | ICD-10-CM | POA: Diagnosis not present

## 2020-11-18 DIAGNOSIS — M25519 Pain in unspecified shoulder: Secondary | ICD-10-CM | POA: Diagnosis not present

## 2020-11-18 DIAGNOSIS — M546 Pain in thoracic spine: Secondary | ICD-10-CM | POA: Diagnosis not present

## 2020-11-18 DIAGNOSIS — M4726 Other spondylosis with radiculopathy, lumbar region: Secondary | ICD-10-CM | POA: Diagnosis not present

## 2020-11-18 DIAGNOSIS — M4807 Spinal stenosis, lumbosacral region: Secondary | ICD-10-CM | POA: Diagnosis not present

## 2021-09-12 IMAGING — DX DG CHEST 1V PORT
1 series · 1 of 1 positions shown · non-contrast
Comparison: 9139

CLINICAL DATA: Hypoxia

EXAM:
PORTABLE CHEST 1 VIEW

[chest ap]
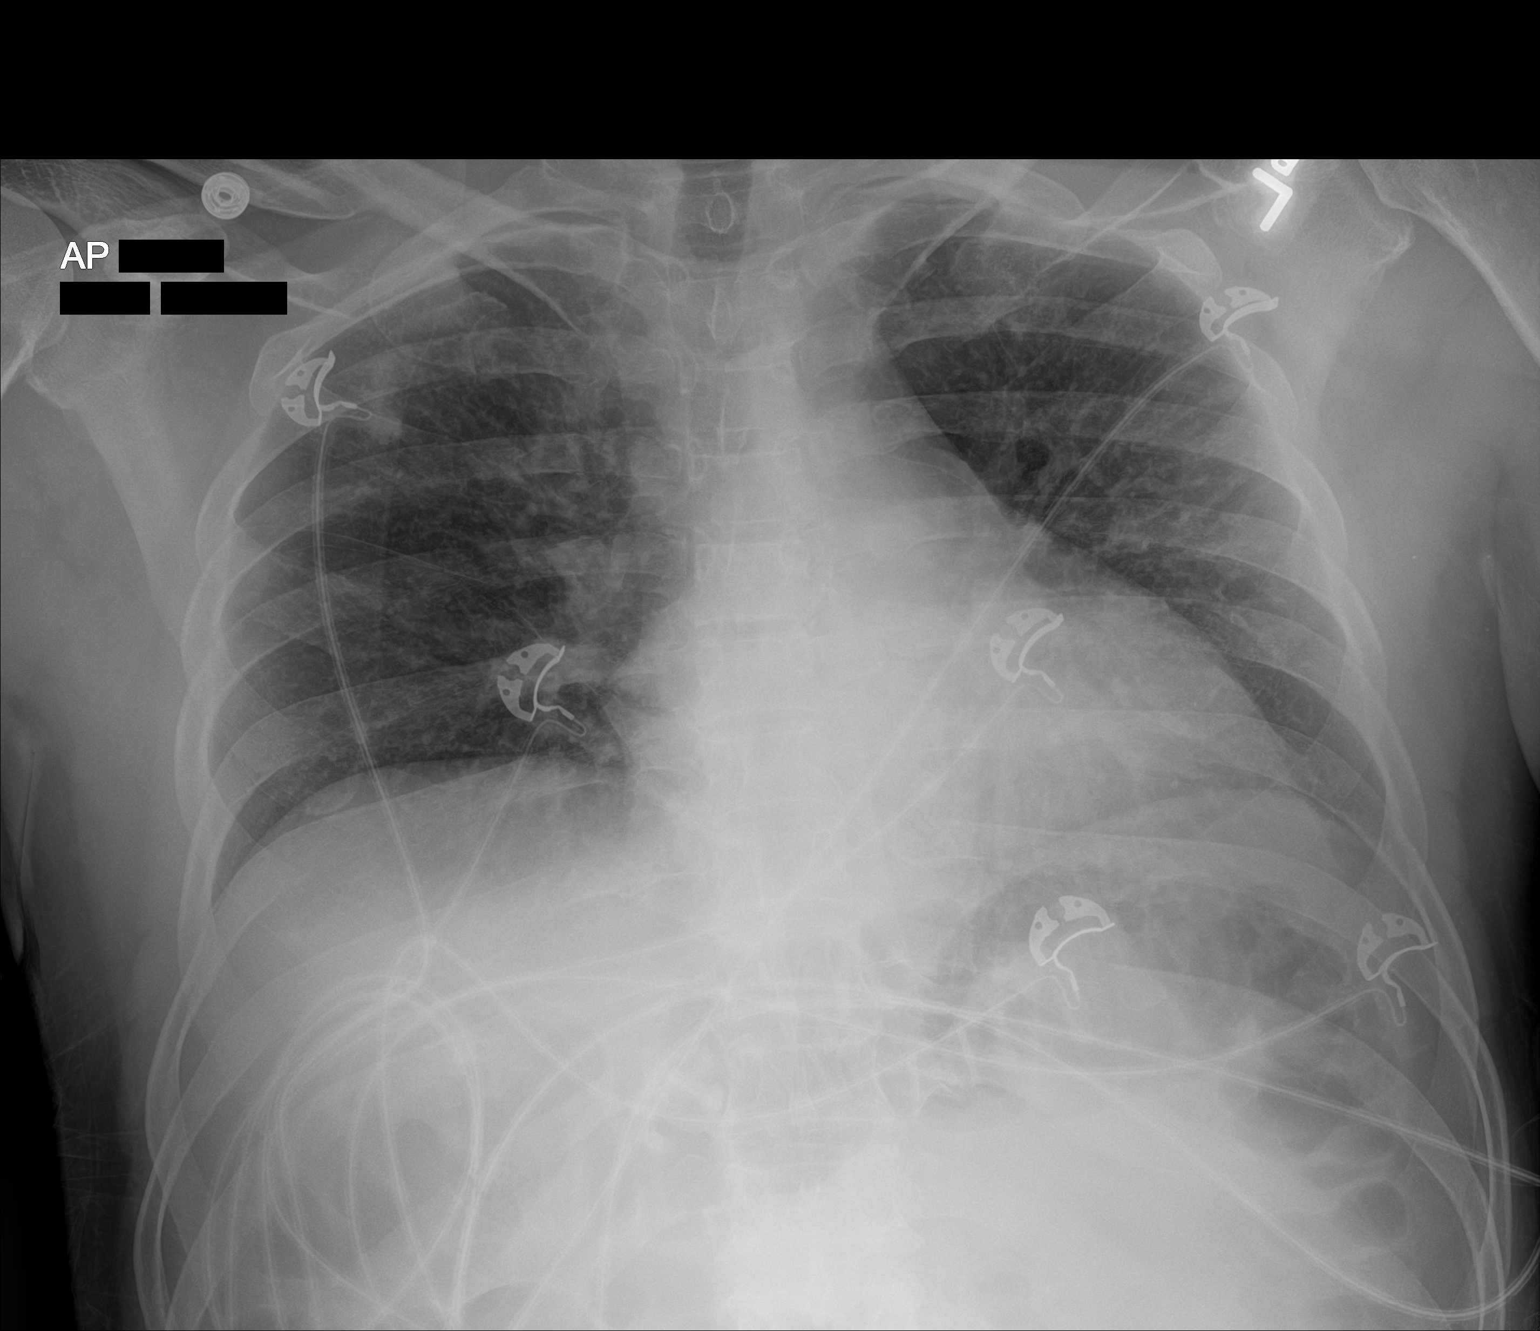

[1 of 1 positions shown; findings below may reference images not displayed]

FINDINGS: Low lung volumes reflecting shallow inspiration. Patchy density
right upper lung density near monitor lead. Lungs are otherwise
clear. No pleural effusion or pneumothorax. Heart size is likely
within normal limits for portable technique.
IMPRESSION: Small area of patchy right upper lung density, which may reflect
atelectasis/consolidation.

## 2021-09-12 IMAGING — CT CT ABD-PELV W/ CM
2 of 5 series · 16 of 46 positions shown, 18 images · IV contrast (APPLIED)
Comparison: None.

CLINICAL DATA: Generalized abdominal pain, nausea, vomiting and
fever

EXAM:
CT ABDOMEN AND PELVIS WITH CONTRAST
TECHNIQUE: Multidetector CT imaging of the abdomen and pelvis was performed
using the standard protocol following bolus administration of
intravenous contrast.
CONTRAST:  80mL OMNIPAQUE IOHEXOL 350 MG/ML SOLN

[Series 3: abd/ pelvis 5.0 i30f 2 · axial · 0.75mm/px · z∈[+952,+1397]mm · 13 of 101 slices shown, 15 images]
[im 6/101  soft-tissue]
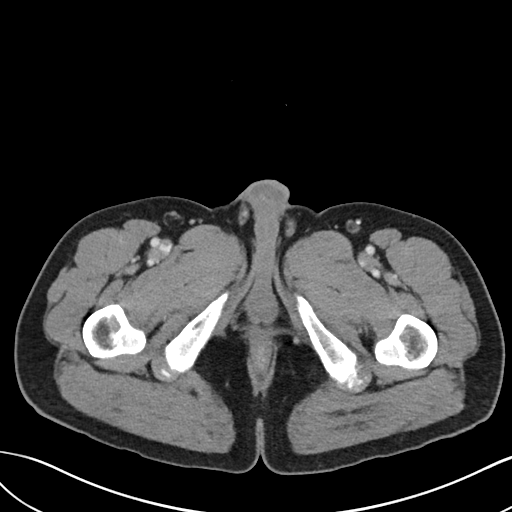
[im 6/101  bone]
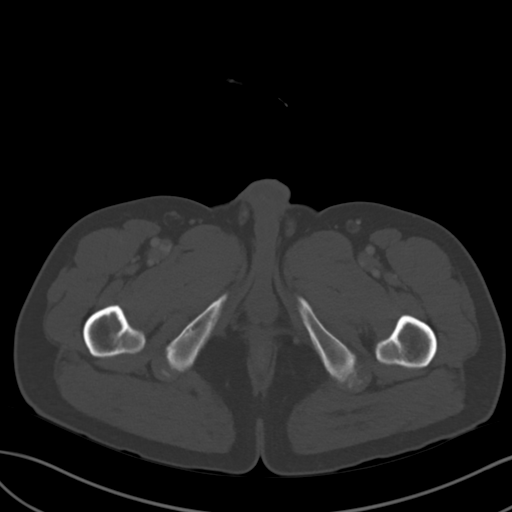
[im 16/101  soft-tissue]
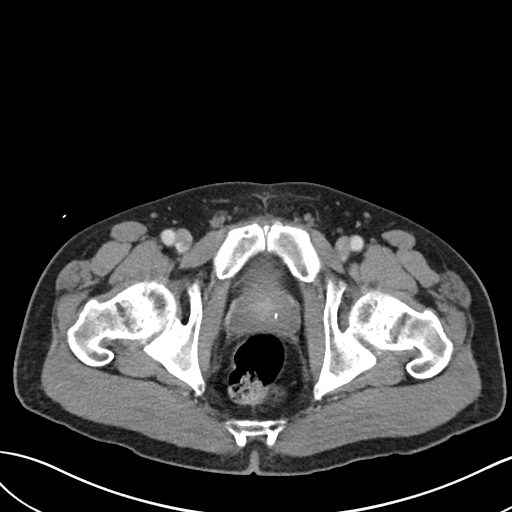
[im 22/101  soft-tissue]
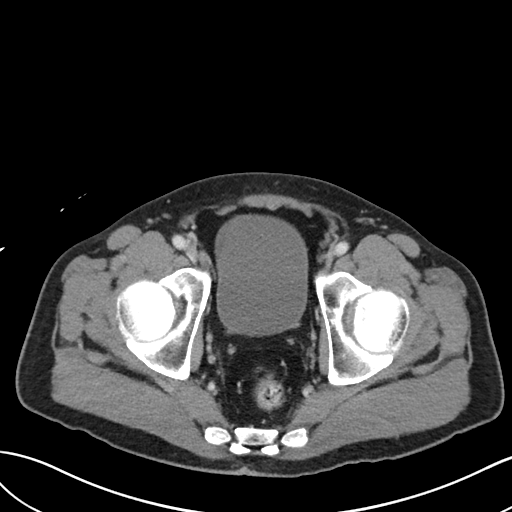
[im 27/101  soft-tissue]
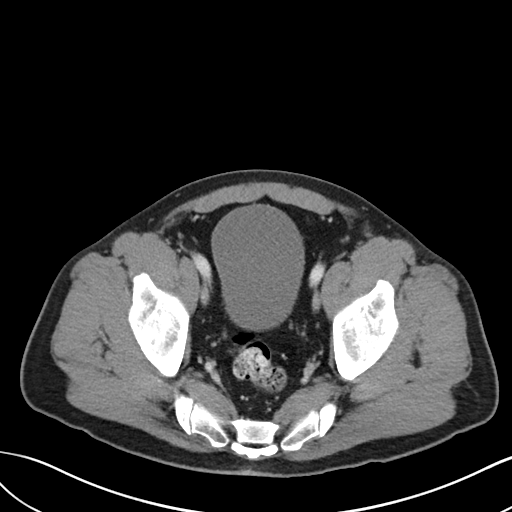
[im 37/101  soft-tissue]
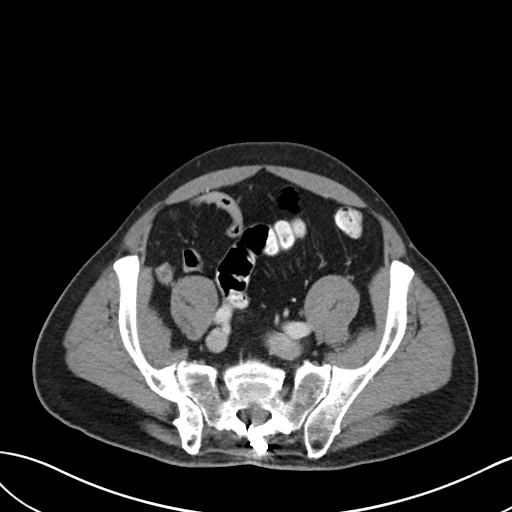
[im 43/101  soft-tissue]
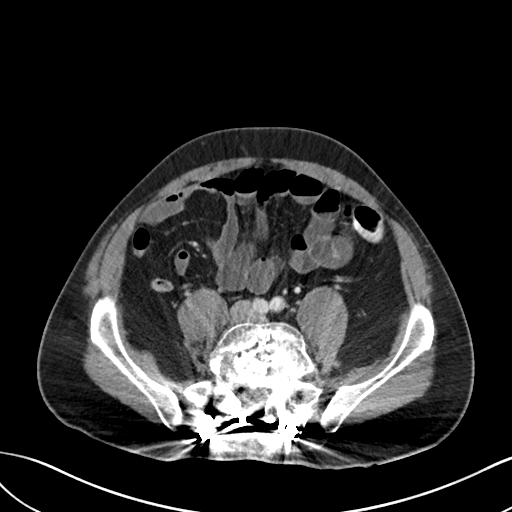
[im 53/101  soft-tissue]
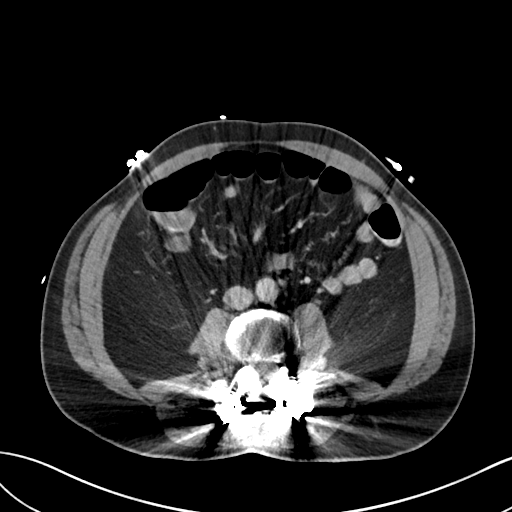
[im 58/101  soft-tissue]
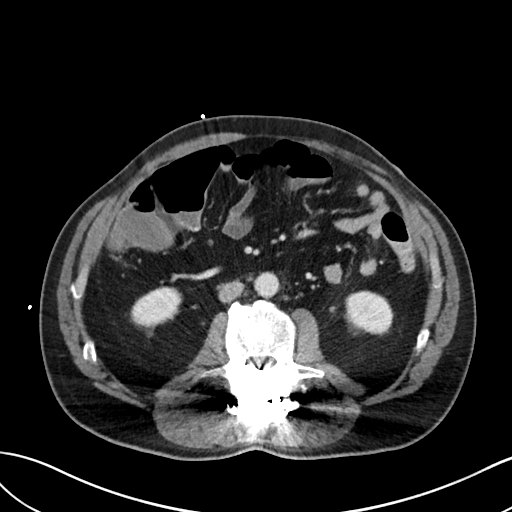
[im 64/101  soft-tissue]
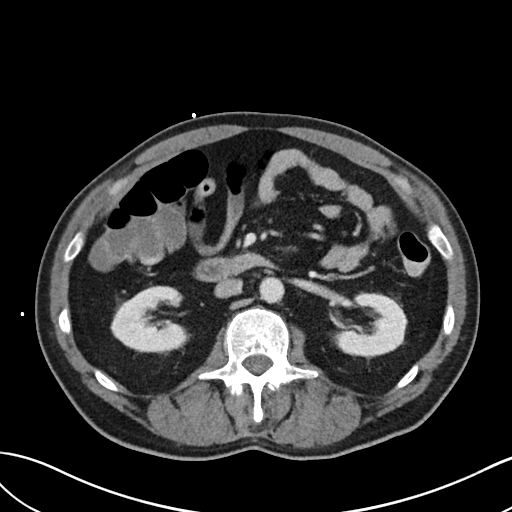
[im 64/101  bone]
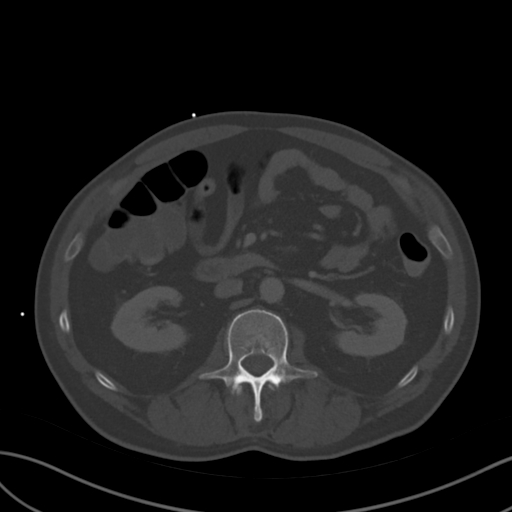
[im 74/101  soft-tissue]
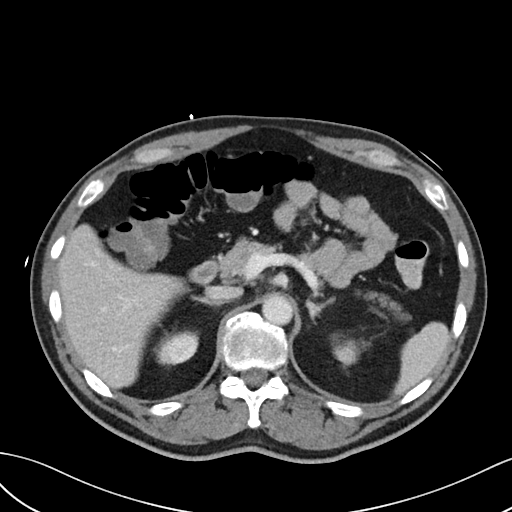
[im 79/101  soft-tissue]
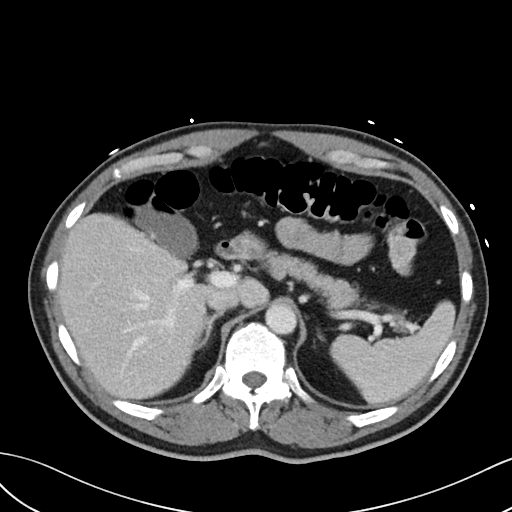
[im 85/101  soft-tissue]
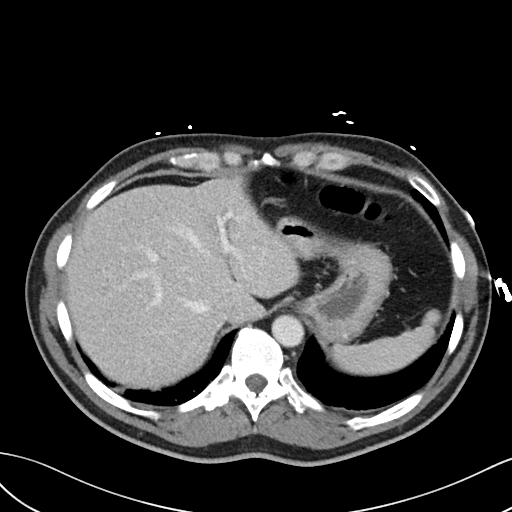
[im 95/101  soft-tissue]
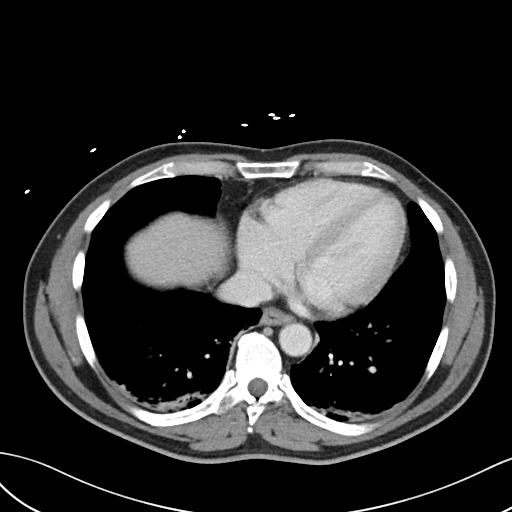

[Series 6: coronal soft tissue · coronal · 0.81mm/px · 3 of 99 slices shown]
[im 33/99  soft-tissue]
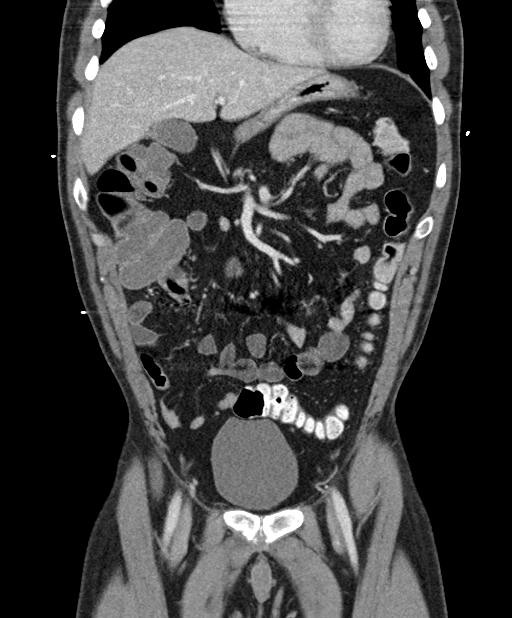
[im 44/99  soft-tissue]
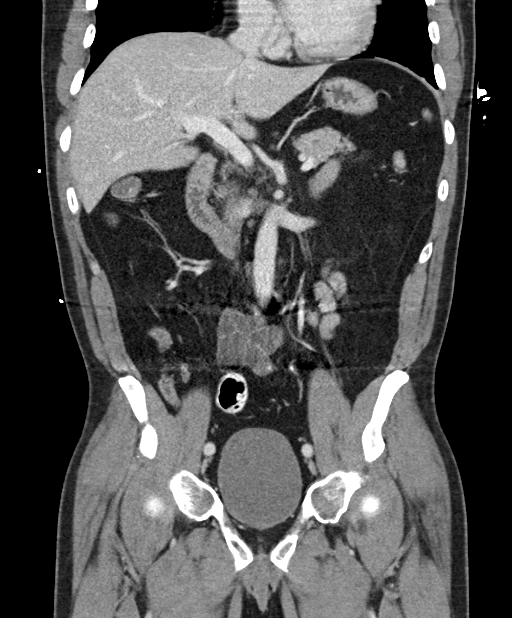
[im 55/99  soft-tissue]
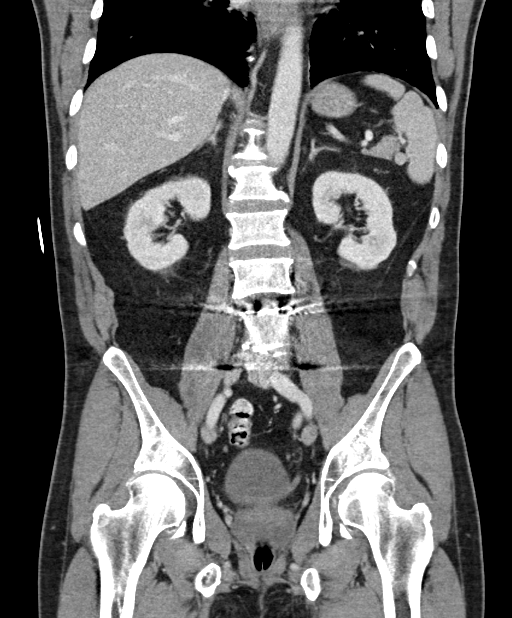

[16 of 46 positions shown; findings below may reference images not displayed]

FINDINGS: Lower chest: Symmetric dependent basilar opacities favored to
represent atelectasis. Normal heart size. No pericardial or pleural
effusion.

Hepatobiliary: No focal liver abnormality is seen. No gallstones,
gallbladder wall thickening, or biliary dilatation.

Pancreas: Unremarkable. No pancreatic ductal dilatation or
surrounding inflammatory changes.

Spleen: Normal in size without focal abnormality.

Adrenals/Urinary Tract: Adrenal glands are unremarkable. Kidneys are
normal, without renal calculi, focal lesion, or hydronephrosis.
Bladder is unremarkable.

Stomach/Bowel: Negative for bowel obstruction, significant
dilatation, ileus, or free air. Mild fluid distension of the distal
small bowel and colon may represent ongoing diarrhea. Appendix not
visualized. No acute inflammatory process in the right lower
quadrant. No area of bowel wall thickening.

Negative for fluid collection, hemorrhage, hematoma, abscess or
ascites.

Vascular/Lymphatic: Intact aorta. Negative for aneurysm. Mesenteric
and renal vasculature appear patent. No Shug process or
bulky adenopathy.

Reproductive: No significant finding by CT.

Other: No abdominal wall hernia or abnormality. No abdominopelvic
ascites.

Musculoskeletal: Degenerative and postoperative changes noted of the
spine.
IMPRESSION: Dependent bibasilar atelectasis.

No other acute intra-abdominal or pelvic finding by CT.

Appendix not visualized

Degenerative and postop findings of the spine.

## 2021-09-30 ENCOUNTER — Other Ambulatory Visit: Payer: Self-pay | Admitting: Nurse Practitioner

## 2021-09-30 DIAGNOSIS — I10 Essential (primary) hypertension: Secondary | ICD-10-CM
